# Patient Record
Sex: Male | Born: 1975 | Race: White | Hispanic: No | Marital: Single | Smoking: Current every day smoker
Health system: Southern US, Community
[De-identification: ages and names within clinical notes are randomized; demographics above are authoritative.]

## PROBLEM LIST (undated history)

## (undated) DIAGNOSIS — M62561 Muscle wasting and atrophy, not elsewhere classified, right lower leg: Secondary | ICD-10-CM

## (undated) DIAGNOSIS — R569 Unspecified convulsions: Secondary | ICD-10-CM

## (undated) DIAGNOSIS — S41031A Puncture wound without foreign body of right shoulder, initial encounter: Secondary | ICD-10-CM

## (undated) DIAGNOSIS — I82409 Acute embolism and thrombosis of unspecified deep veins of unspecified lower extremity: Secondary | ICD-10-CM

## (undated) DIAGNOSIS — I714 Abdominal aortic aneurysm, without rupture, unspecified: Secondary | ICD-10-CM

## (undated) DIAGNOSIS — W3400XA Accidental discharge from unspecified firearms or gun, initial encounter: Secondary | ICD-10-CM

## (undated) HISTORY — PX: MANDIBLE SURGERY: SHX707

## (undated) HISTORY — PX: FRACTURE SURGERY: SHX138

## (undated) HISTORY — PX: TONSILLECTOMY: SUR1361

## (undated) HISTORY — DX: Abdominal aortic aneurysm, without rupture, unspecified: I71.40

## (undated) HISTORY — PX: SHOULDER SURGERY: SHX246

---

## 2005-01-05 ENCOUNTER — Emergency Department (HOSPITAL_COMMUNITY): Admission: EM | Admit: 2005-01-05 | Discharge: 2005-01-05 | Payer: Self-pay | Admitting: Emergency Medicine

## 2005-01-16 ENCOUNTER — Emergency Department (HOSPITAL_COMMUNITY): Admission: EM | Admit: 2005-01-16 | Discharge: 2005-01-16 | Payer: Self-pay | Admitting: Emergency Medicine

## 2005-03-10 ENCOUNTER — Encounter: Admission: RE | Admit: 2005-03-10 | Discharge: 2005-03-31 | Payer: Self-pay | Admitting: Podiatry

## 2010-03-15 ENCOUNTER — Emergency Department: Payer: Self-pay | Admitting: Emergency Medicine

## 2010-03-23 ENCOUNTER — Other Ambulatory Visit: Payer: Self-pay

## 2010-03-24 ENCOUNTER — Emergency Department: Payer: Self-pay | Admitting: Unknown Physician Specialty

## 2010-04-09 ENCOUNTER — Emergency Department: Payer: Self-pay | Admitting: Emergency Medicine

## 2010-06-16 ENCOUNTER — Emergency Department (HOSPITAL_COMMUNITY)
Admission: EM | Admit: 2010-06-16 | Discharge: 2010-06-16 | Disposition: A | Discharge status: Home / Self Care | Payer: Medicaid Other | Attending: Emergency Medicine | Admitting: Emergency Medicine

## 2010-06-16 DIAGNOSIS — M25519 Pain in unspecified shoulder: Secondary | ICD-10-CM | POA: Insufficient documentation

## 2010-06-16 DIAGNOSIS — G8929 Other chronic pain: Secondary | ICD-10-CM | POA: Insufficient documentation

## 2010-06-16 DIAGNOSIS — E669 Obesity, unspecified: Secondary | ICD-10-CM | POA: Insufficient documentation

## 2010-06-16 DIAGNOSIS — Z79899 Other long term (current) drug therapy: Secondary | ICD-10-CM | POA: Insufficient documentation

## 2010-06-16 DIAGNOSIS — G40909 Epilepsy, unspecified, not intractable, without status epilepticus: Secondary | ICD-10-CM | POA: Insufficient documentation

## 2012-08-14 ENCOUNTER — Emergency Department: Payer: Self-pay | Admitting: Unknown Physician Specialty

## 2014-12-16 ENCOUNTER — Emergency Department: Payer: Medicaid Other

## 2014-12-16 ENCOUNTER — Emergency Department
Admission: EM | Admit: 2014-12-16 | Discharge: 2014-12-16 | Disposition: A | Discharge status: Home / Self Care | Payer: Medicaid Other | Attending: Emergency Medicine | Admitting: Emergency Medicine

## 2014-12-16 ENCOUNTER — Other Ambulatory Visit: Payer: Self-pay

## 2014-12-16 DIAGNOSIS — R1013 Epigastric pain: Secondary | ICD-10-CM | POA: Insufficient documentation

## 2014-12-16 DIAGNOSIS — Z720 Tobacco use: Secondary | ICD-10-CM | POA: Diagnosis not present

## 2014-12-16 HISTORY — DX: Puncture wound without foreign body of right shoulder, initial encounter: S41.031A

## 2014-12-16 HISTORY — DX: Muscle wasting and atrophy, not elsewhere classified, right lower leg: M62.561

## 2014-12-16 HISTORY — DX: Accidental discharge from unspecified firearms or gun, initial encounter: W34.00XA

## 2014-12-16 LAB — CBC WITH DIFFERENTIAL/PLATELET
BASOS PCT: 1 %
Basophils Absolute: 0.1 10*3/uL (ref 0–0.1)
Eosinophils Absolute: 0.3 10*3/uL (ref 0–0.7)
Eosinophils Relative: 3 %
HEMATOCRIT: 43.7 % (ref 40.0–52.0)
Hemoglobin: 14.5 g/dL (ref 13.0–18.0)
LYMPHS ABS: 2.6 10*3/uL (ref 1.0–3.6)
Lymphocytes Relative: 21 %
MCH: 30.1 pg (ref 26.0–34.0)
MCHC: 33.1 g/dL (ref 32.0–36.0)
MCV: 90.7 fL (ref 80.0–100.0)
MONO ABS: 0.9 10*3/uL (ref 0.2–1.0)
MONOS PCT: 7 %
NEUTROS ABS: 8.5 10*3/uL — AB (ref 1.4–6.5)
Neutrophils Relative %: 68 %
Platelets: 299 10*3/uL (ref 150–440)
RBC: 4.81 MIL/uL (ref 4.40–5.90)
RDW: 13.7 % (ref 11.5–14.5)
WBC: 12.4 10*3/uL — ABNORMAL HIGH (ref 3.8–10.6)

## 2014-12-16 LAB — COMPREHENSIVE METABOLIC PANEL
ALK PHOS: 67 U/L (ref 38–126)
ALT: 11 U/L — ABNORMAL LOW (ref 17–63)
ANION GAP: 8 (ref 5–15)
AST: 16 U/L (ref 15–41)
Albumin: 3.9 g/dL (ref 3.5–5.0)
BILIRUBIN TOTAL: 0.3 mg/dL (ref 0.3–1.2)
BUN: 9 mg/dL (ref 6–20)
CALCIUM: 9 mg/dL (ref 8.9–10.3)
CO2: 28 mmol/L (ref 22–32)
CREATININE: 0.96 mg/dL (ref 0.61–1.24)
Chloride: 103 mmol/L (ref 101–111)
Glucose, Bld: 100 mg/dL — ABNORMAL HIGH (ref 65–99)
Potassium: 3.9 mmol/L (ref 3.5–5.1)
SODIUM: 139 mmol/L (ref 135–145)
TOTAL PROTEIN: 6.7 g/dL (ref 6.5–8.1)

## 2014-12-16 LAB — TROPONIN I: Troponin I: <0.03 ng/mL (ref <0.031)

## 2014-12-16 LAB — LIPASE, BLOOD: LIPASE: 17 U/L — AB (ref 22–51)

## 2014-12-16 MED ORDER — MORPHINE SULFATE (PF) 4 MG/ML IV SOLN
4.0000 mg | Freq: Once | INTRAVENOUS | Status: AC
  Administered 2014-12-16: 4 mg via INTRAVENOUS
  Filled 2014-12-16: qty 1

## 2014-12-16 MED ORDER — IOHEXOL 350 MG/ML SOLN
100.0000 mL | Freq: Once | INTRAVENOUS | Status: AC | PRN
  Administered 2014-12-16: 100 mL via INTRAVENOUS

## 2014-12-16 MED ORDER — ESOMEPRAZOLE MAGNESIUM 40 MG PO CPDR: 40.0000 mg | DELAYED_RELEASE_CAPSULE | Freq: Every day | ORAL | Status: DC

## 2014-12-16 MED ORDER — GI COCKTAIL ~~LOC~~
ORAL | Status: AC
  Filled 2014-12-16: qty 30

## 2014-12-16 MED ORDER — GI COCKTAIL ~~LOC~~
30.0000 mL | Freq: Once | ORAL | Status: AC
Start: 2014-12-16 — End: 2014-12-16
  Administered 2014-12-16: 30 mL via ORAL
  Filled 2014-12-16: qty 30

## 2014-12-16 MED ORDER — IOHEXOL 240 MG/ML SOLN
25.0000 mL | Freq: Once | INTRAMUSCULAR | Status: AC | PRN
  Administered 2014-12-16: 25 mL via ORAL

## 2014-12-16 MED ORDER — GI COCKTAIL ~~LOC~~
30.0000 mL | Freq: Once | ORAL | Status: AC
  Administered 2014-12-16: 30 mL via ORAL

## 2014-12-16 NOTE — Discharge Instructions (Signed)
Abdominal Pain Many things can cause abdominal pain. Usually, abdominal pain is not caused by a disease and will improve without treatment. It can often be observed and treated at home. Your health care provider will do a physical exam and possibly order blood tests and X-rays to help determine the seriousness of your pain. However, in many cases, more time must pass before a clear cause of the pain can be found. Before that point, your health care provider may not know if you need more testing or further treatment. HOME CARE INSTRUCTIONS  Monitor your abdominal pain for any changes. The following actions may help to alleviate any discomfort you are experiencing:  Only take over-the-counter or prescription medicines as directed by your health care provider.  Do not take laxatives unless directed to do so by your health care provider.  Try a clear liquid diet (broth, tea, or water) as directed by your health care provider. Slowly move to a bland diet as tolerated. SEEK MEDICAL CARE IF:  You have unexplained abdominal pain.  You have abdominal pain associated with nausea or diarrhea.  You have pain when you urinate or have a bowel movement.  You experience abdominal pain that wakes you in the night.  You have abdominal pain that is worsened or improved by eating food.  You have abdominal pain that is worsened with eating fatty foods.  You have a fever. SEEK IMMEDIATE MEDICAL CARE IF:   Your pain does not go away within 2 hours.  You keep throwing up (vomiting).  Your pain is felt only in portions of the abdomen, such as the right side or the left lower portion of the abdomen.  You pass bloody or black tarry stools. MAKE SURE YOU:  Understand these instructions.   Will watch your condition.   Will get help right away if you are not doing well or get worse.  Document Released: 01/21/2005 Document Revised: 04/18/2013 Document Reviewed: 12/21/2012 [facility] Patient Information  2015 [place], [place]. This information is not intended to replace advice given to you by your health care provider. Make sure you discuss any questions you have with your health care provider.   Please take the nexium as directed. Follow up with your primary care doctor. Return if worse at all.
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
## 2014-12-16 NOTE — ED Notes (Signed)
According to EMS, pt states he has epigastric pain that radiates into his LEFT AND RIGHT CHEST AND TO ARMS BILATERALLY. EMS STATES PAIN WAS ABLE TO BE REPRODUCED WITH ANY MOVEMENTS.  EMS Administerd x2 Nitro tablets and  Aspirin.

## 2015-10-04 ENCOUNTER — Emergency Department
Admission: EM | Admit: 2015-10-04 | Discharge: 2015-10-04 | Disposition: A | Discharge status: Home / Self Care | Payer: Medicaid Other | Attending: Emergency Medicine | Admitting: Emergency Medicine

## 2015-10-04 ENCOUNTER — Encounter: Payer: Self-pay | Admitting: Emergency Medicine

## 2015-10-04 ENCOUNTER — Emergency Department: Payer: Medicaid Other

## 2015-10-04 DIAGNOSIS — G40909 Epilepsy, unspecified, not intractable, without status epilepticus: Secondary | ICD-10-CM | POA: Insufficient documentation

## 2015-10-04 DIAGNOSIS — T782XXA Anaphylactic shock, unspecified, initial encounter: Secondary | ICD-10-CM | POA: Diagnosis not present

## 2015-10-04 DIAGNOSIS — Z79899 Other long term (current) drug therapy: Secondary | ICD-10-CM | POA: Insufficient documentation

## 2015-10-04 DIAGNOSIS — F17200 Nicotine dependence, unspecified, uncomplicated: Secondary | ICD-10-CM | POA: Diagnosis not present

## 2015-10-04 DIAGNOSIS — R42 Dizziness and giddiness: Secondary | ICD-10-CM | POA: Diagnosis present

## 2015-10-04 DIAGNOSIS — R569 Unspecified convulsions: Secondary | ICD-10-CM

## 2015-10-04 HISTORY — DX: Unspecified convulsions: R56.9

## 2015-10-04 LAB — COMPREHENSIVE METABOLIC PANEL
ALK PHOS: 52 U/L (ref 38–126)
ALT: 12 U/L — AB (ref 17–63)
ANION GAP: 14 (ref 5–15)
AST: 23 U/L (ref 15–41)
Albumin: 3.9 g/dL (ref 3.5–5.0)
BUN: 12 mg/dL (ref 6–20)
CALCIUM: 8.3 mg/dL — AB (ref 8.9–10.3)
CHLORIDE: 109 mmol/L (ref 101–111)
CO2: 18 mmol/L — ABNORMAL LOW (ref 22–32)
CREATININE: 1.35 mg/dL — AB (ref 0.61–1.24)
Glucose, Bld: 142 mg/dL — ABNORMAL HIGH (ref 65–99)
Potassium: 2.8 mmol/L — CL (ref 3.5–5.1)
Sodium: 141 mmol/L (ref 135–145)
TOTAL PROTEIN: 6.4 g/dL — AB (ref 6.5–8.1)
Total Bilirubin: 0.8 mg/dL (ref 0.3–1.2)

## 2015-10-04 LAB — CBC WITH DIFFERENTIAL/PLATELET
BASOS ABS: 0 10*3/uL (ref 0–0.1)
EOS ABS: 0.2 10*3/uL (ref 0–0.7)
HCT: 48.9 % (ref 40.0–52.0)
Hemoglobin: 16.3 g/dL (ref 13.0–18.0)
Lymphocytes Relative: 15 %
Lymphs Abs: 2.6 10*3/uL (ref 1.0–3.6)
MCH: 30.6 pg (ref 26.0–34.0)
MCHC: 33.3 g/dL (ref 32.0–36.0)
MCV: 92.1 fL (ref 80.0–100.0)
MONO ABS: 0.6 10*3/uL (ref 0.2–1.0)
Monocytes Relative: 3 %
Neutro Abs: 13.5 10*3/uL — ABNORMAL HIGH (ref 1.4–6.5)
Neutrophils Relative %: 81 %
PLATELETS: 304 10*3/uL (ref 150–440)
RBC: 5.31 MIL/uL (ref 4.40–5.90)
RDW: 14.5 % (ref 11.5–14.5)
WBC: 16.9 10*3/uL — ABNORMAL HIGH (ref 3.8–10.6)

## 2015-10-04 LAB — MAGNESIUM: Magnesium: 1.5 mg/dL — ABNORMAL LOW (ref 1.7–2.4)

## 2015-10-04 MED ORDER — MAGNESIUM SULFATE 2 GM/50ML IV SOLN
2.0000 g | Freq: Once | INTRAVENOUS | Status: AC
  Administered 2015-10-04: 2 g via INTRAVENOUS
  Filled 2015-10-04: qty 50

## 2015-10-04 MED ORDER — ONDANSETRON HCL 4 MG/2ML IJ SOLN
4.0000 mg | Freq: Once | INTRAMUSCULAR | Status: AC
  Administered 2015-10-04: 4 mg via INTRAVENOUS
  Filled 2015-10-04: qty 2

## 2015-10-04 MED ORDER — MORPHINE SULFATE (PF) 4 MG/ML IV SOLN
4.0000 mg | Freq: Once | INTRAVENOUS | Status: AC
  Administered 2015-10-04: 4 mg via INTRAVENOUS
  Filled 2015-10-04: qty 1

## 2015-10-04 MED ORDER — ACETAMINOPHEN 500 MG PO TABS
1000.0000 mg | ORAL_TABLET | Freq: Once | ORAL | Status: AC
  Administered 2015-10-04: 1000 mg via ORAL
  Filled 2015-10-04: qty 2

## 2015-10-04 MED ORDER — POTASSIUM CHLORIDE 20 MEQ/15ML (10%) PO SOLN
40.0000 meq | Freq: Once | ORAL | Status: AC
  Administered 2015-10-04: 40 meq via ORAL
  Filled 2015-10-04: qty 30

## 2015-10-04 MED ORDER — METHYLPREDNISOLONE SODIUM SUCC 125 MG IJ SOLR
125.0000 mg | Freq: Once | INTRAMUSCULAR | Status: AC
  Administered 2015-10-04: 125 mg via INTRAVENOUS
  Filled 2015-10-04: qty 2
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
## 2015-10-04 NOTE — ED Notes (Signed)
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  Pt. States he was stung once behind head.  Pt. States soon after he felt dizzy and thinks he may have had seizure. Pt. Denies being on seizure medication.  Pt. States he has been feeling weak for the past couple days.

## 2015-10-04 NOTE — ED Notes (Signed)
Pt. States he was stung by a [place] hornet to the back of head.  Pt. States he had seizure like activity soon after getting stung.

## 2015-10-04 NOTE — ED Provider Notes (Signed)
Patient is awake alert states no issues. Was being observed, and the patient noted that he is ready to go. He is hemodynamically stable, awake alert and has no complaints or concerns at this time. Patient being discharged home with prescriptions as provided by Dr. Darnelle CatalanMalinda.  Return precautions and treatment recommendations and follow-up discussed with the patient who is agreeable with the plan.   Sharyn CreamerMark Chatara Lucente, MD 10/04/15 [phone]

## 2015-10-04 NOTE — Discharge Instructions (Signed)
Anaphylactic Reaction °An anaphylactic reaction is a sudden, severe allergic reaction that involves the whole body. It can be life threatening. A hospital stay is often required. People with asthma, eczema, or hay fever are slightly more likely to have an anaphylactic reaction. °CAUSES  °An anaphylactic reaction may be caused by anything to which you are allergic. After being exposed to the allergic substance, your immune system becomes sensitized to it. When you are exposed to that allergic substance again, an allergic reaction can occur. Common causes of an anaphylactic reaction include: °· Medicines. °· Foods, especially peanuts, wheat, shellfish, milk, and eggs. °· Insect bites or stings. °· Blood products. °· Chemicals, such as dyes, latex, and contrast material used for imaging tests. °SYMPTOMS  °When an allergic reaction occurs, the body releases histamine and other substances. These substances cause symptoms such as tightening of the airway. Symptoms often develop within seconds or minutes of exposure. Symptoms may include: °· Skin rash or hives. °· Itching. °· Chest tightness. °· Swelling of the eyes, tongue, or lips. °· Trouble breathing or swallowing. °· Lightheadedness or fainting. °· Anxiety or confusion. °· Stomach pains, vomiting, or diarrhea. °· Nasal congestion. °· A fast or irregular heartbeat (palpitations). °DIAGNOSIS  °Diagnosis is based on your history of recent exposure to allergic substances, your symptoms, and a physical exam. Your caregiver may also perform blood or urine tests to confirm the diagnosis. °TREATMENT  °Epinephrine medicine is the main treatment for an anaphylactic reaction. Other medicines that may be used for treatment include antihistamines, steroids, and albuterol. In severe cases, fluids and medicine to support blood pressure may be given through an intravenous line (IV). Even if you improve after treatment, you need to be observed to make sure your condition does not get  worse. This may require a stay in the hospital. °HOME CARE INSTRUCTIONS  °· Wear a medical alert bracelet or necklace stating your allergy. °· You and your family must learn how to use an anaphylaxis kit or give an epinephrine injection to temporarily treat an emergency allergic reaction. Always carry your epinephrine injection or anaphylaxis kit with you. This can be lifesaving if you have a severe reaction. °· Do not drive or perform tasks after treatment until the medicines used to treat your reaction have worn off, or until your caregiver says it is okay. °· If you have hives or a rash: °¨ Take medicines as directed by your caregiver. °¨ You may use an over-the-counter antihistamine (diphenhydramine) as needed. °¨ Apply cold compresses to the skin or take baths in cool water. Avoid hot baths or showers. °SEEK MEDICAL CARE IF:  °· You develop symptoms of an allergic reaction to a new substance. Symptoms may start right away or minutes later. °· You develop a rash, hives, or itching. °· You develop new symptoms. °SEEK IMMEDIATE MEDICAL CARE IF:  °· You have swelling of the mouth, difficulty breathing, or wheezing. °· You have a tight feeling in your chest or throat. °· You develop hives, swelling, or itching all over your body. °· You develop severe vomiting or diarrhea. °· You feel faint or pass out. °This is an emergency. Use your epinephrine injection or anaphylaxis kit as you have been instructed. Call your local emergency services (911 in [place] Even if you improve after the injection, you need to be examined at a hospital emergency department. °MAKE SURE YOU:  °· Understand these instructions. °· Will watch your condition. °· Will get help right away if you are not   doing well or get worse.   This information is not intended to replace advice given to you by your health care provider. Make sure you discuss any questions you have with your health care provider.   Document Released: 04/13/2005 Document  Revised: 04/18/2013 Document Reviewed: 10/24/2014 Elsevier Interactive Patient Education 2016 [place].   Please call Dr. Manuella Ghazi the neurologist on Monday to arrange follow-up. Please return here for any further problems. Please keep the EpiPen with you just in case you get stung by another one of these insects. If you start getting lightheaded or throat starts to close can give yourself the shot. After that immediately call 911. Please take the prednisone tomorrow and Benadryl to the over-the-counter pills 4 times a day tomorrow. Please take the Zantac tomorrow as well.

## 2016-03-25 ENCOUNTER — Emergency Department
Admission: EM | Admit: 2016-03-25 | Discharge: 2016-03-25 | Disposition: A | Discharge status: Home / Self Care | Payer: Medicaid Other | Attending: Emergency Medicine | Admitting: Emergency Medicine

## 2016-03-25 ENCOUNTER — Encounter: Payer: Self-pay | Admitting: *Deleted

## 2016-03-25 DIAGNOSIS — R569 Unspecified convulsions: Secondary | ICD-10-CM | POA: Diagnosis present

## 2016-03-25 DIAGNOSIS — G8929 Other chronic pain: Secondary | ICD-10-CM | POA: Diagnosis not present

## 2016-03-25 DIAGNOSIS — F17200 Nicotine dependence, unspecified, uncomplicated: Secondary | ICD-10-CM | POA: Insufficient documentation

## 2016-03-25 DIAGNOSIS — G40909 Epilepsy, unspecified, not intractable, without status epilepticus: Secondary | ICD-10-CM | POA: Diagnosis not present

## 2016-03-25 DIAGNOSIS — M25511 Pain in right shoulder: Secondary | ICD-10-CM | POA: Diagnosis not present

## 2016-03-25 LAB — CBC WITH DIFFERENTIAL/PLATELET
BASOS ABS: 0.1 10*3/uL (ref 0–0.1)
BASOS PCT: 1 %
EOS PCT: 2 %
Eosinophils Absolute: 0.3 10*3/uL (ref 0–0.7)
HCT: 44.1 % (ref 40.0–52.0)
Hemoglobin: 14.8 g/dL (ref 13.0–18.0)
Lymphocytes Relative: 18 %
Lymphs Abs: 2.5 10*3/uL (ref 1.0–3.6)
MCH: 30.5 pg (ref 26.0–34.0)
MCHC: 33.5 g/dL (ref 32.0–36.0)
MCV: 91.1 fL (ref 80.0–100.0)
MONO ABS: 0.8 10*3/uL (ref 0.2–1.0)
Monocytes Relative: 6 %
Neutro Abs: 10.2 10*3/uL — ABNORMAL HIGH (ref 1.4–6.5)
Neutrophils Relative %: 73 %
PLATELETS: 311 10*3/uL (ref 150–440)
RBC: 4.84 MIL/uL (ref 4.40–5.90)
RDW: 16 % — AB (ref 11.5–14.5)
WBC: 13.9 10*3/uL — ABNORMAL HIGH (ref 3.8–10.6)

## 2016-03-25 LAB — COMPREHENSIVE METABOLIC PANEL
ALK PHOS: 58 U/L (ref 38–126)
ALT: 24 U/L (ref 17–63)
ANION GAP: 12 (ref 5–15)
AST: 35 U/L (ref 15–41)
Albumin: 4.2 g/dL (ref 3.5–5.0)
BILIRUBIN TOTAL: 0.6 mg/dL (ref 0.3–1.2)
BUN: 13 mg/dL (ref 6–20)
CALCIUM: 9.2 mg/dL (ref 8.9–10.3)
CO2: 19 mmol/L — ABNORMAL LOW (ref 22–32)
Chloride: 107 mmol/L (ref 101–111)
Creatinine, Ser: 1.06 mg/dL (ref 0.61–1.24)
GFR calc non Af Amer: >60 mL/min (ref >60)
Glucose, Bld: 115 mg/dL — ABNORMAL HIGH (ref 65–99)
Potassium: 4.3 mmol/L (ref 3.5–5.1)
Sodium: 138 mmol/L (ref 135–145)
TOTAL PROTEIN: 7.4 g/dL (ref 6.5–8.1)

## 2016-03-25 LAB — ETHANOL

## 2016-03-25 LAB — ACETAMINOPHEN LEVEL: Acetaminophen (Tylenol), Serum: <10 ug/mL — ABNORMAL LOW (ref 10–30)

## 2016-03-25 LAB — SALICYLATE LEVEL

## 2016-03-25 LAB — LIPASE, BLOOD: LIPASE: 20 U/L (ref 11–51)

## 2016-03-25 NOTE — ED Triage Notes (Signed)
Per EMS report, patient's girlfriend reported a seizure of unknown duration. Upon EMS arrival, patient couldn't remember the year or that Thanksgiving was recent, but improved during the transport here. Patient has a history of seizure and said that he was taken off Gabapentin 5months ago which he was taking for seizures. Patient also states he had a "shaking fit" yesterday.
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
## 2016-03-25 NOTE — ED Notes (Addendum)
Seizure pads are in place. Family at bedside.

## 2016-03-25 NOTE — ED Notes (Signed)
Patient states he is  unable to void at this time. Patient c/o nausea and sharp chest pain that radiates to right shoulder. Patient has had this pain for two years.

## 2016-04-06 ENCOUNTER — Emergency Department
Admission: EM | Admit: 2016-04-06 | Discharge: 2016-04-06 | Disposition: A | Discharge status: Home / Self Care | Payer: Medicaid Other | Attending: Emergency Medicine | Admitting: Emergency Medicine

## 2016-04-06 ENCOUNTER — Encounter: Payer: Self-pay | Admitting: Emergency Medicine

## 2016-04-06 DIAGNOSIS — F17200 Nicotine dependence, unspecified, uncomplicated: Secondary | ICD-10-CM | POA: Diagnosis not present

## 2016-04-06 DIAGNOSIS — Y929 Unspecified place or not applicable: Secondary | ICD-10-CM | POA: Insufficient documentation

## 2016-04-06 DIAGNOSIS — S0993XA Unspecified injury of face, initial encounter: Secondary | ICD-10-CM | POA: Diagnosis present

## 2016-04-06 DIAGNOSIS — W19XXXA Unspecified fall, initial encounter: Secondary | ICD-10-CM | POA: Insufficient documentation

## 2016-04-06 DIAGNOSIS — Y999 Unspecified external cause status: Secondary | ICD-10-CM | POA: Diagnosis not present

## 2016-04-06 DIAGNOSIS — K047 Periapical abscess without sinus: Secondary | ICD-10-CM

## 2016-04-06 DIAGNOSIS — Y939 Activity, unspecified: Secondary | ICD-10-CM | POA: Insufficient documentation

## 2016-04-06 DIAGNOSIS — K029 Dental caries, unspecified: Secondary | ICD-10-CM | POA: Insufficient documentation

## 2016-04-06 DIAGNOSIS — S025XXA Fracture of tooth (traumatic), initial encounter for closed fracture: Secondary | ICD-10-CM | POA: Insufficient documentation

## 2016-04-06 MED ORDER — AMOXICILLIN 875 MG PO TABS: 875.0000 mg | ORAL_TABLET | Freq: Two times a day (BID) | ORAL | 0 refills | Status: DC

## 2016-04-06 MED ORDER — LIDOCAINE-EPINEPHRINE 2 %-1:100000 IJ SOLN
1.7000 mL | Freq: Once | INTRAMUSCULAR | Status: DC
  Filled 2016-04-06: qty 1.7

## 2016-04-06 MED ORDER — MAGIC MOUTHWASH W/LIDOCAINE: 5.0000 mL | Freq: Four times a day (QID) | ORAL | 0 refills | Status: DC
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
## 2016-04-06 NOTE — ED Triage Notes (Signed)
States he had a filling fall out   Developed increased pain and swelling to right side of face

## 2016-04-06 NOTE — Discharge Instructions (Signed)
OPTIONS FOR DENTAL FOLLOW UP CARE ° °[place] Department of Health and Human Services - Local Safety Net Dental Clinics °http://www.ncdhhs.gov/dph/oralhealth/services/safetynetclinics.htm °  °[facility] (336-562-3123) ° °[facility] [place] ([phone]) ° °[facility] ([phone] ext [phone]) ° °[facility] ([phone]) ° °[facility] ([phone]) °This clinic caters to the indigent population and is on a lottery system. °Location: °[facility], [address], [place] °Clinic Hours: °Wednesdays from 6pm - 9pm, patients seen by a lottery system. °For dates, call or go to [email or web address]/[email or web address] °Services: °Cleanings, fillings and simple extractions. °Payment Options: °DENTAL WORK IS FREE OF CHARGE. Bring proof of income or support. °Best way to get seen: °Arrive at 5:15 pm - this is a lottery, NOT first come/first serve, so arriving earlier will not increase your chances of being seen. °  °  °[facility] °[phone] °Select option 1 for emergencies °  °Location: °[facility], [address], [place] °Clinic Hours: °No walk-ins accepted - call the day before to schedule an appointment. °Check in times are 9:30 am and 1:30 pm. °Services: °Simple extractions, temporary fillings, pulpectomy/pulp debridement, uncomplicated abscess drainage. °Payment Options: °PAYMENT IS DUE AT THE TIME OF SERVICE.  Fee is usually $100-200, additional surgical procedures (e.g. abscess drainage) may be extra. °Cash, checks, Visa/MasterCard accepted.  Can file Medicaid if patient is covered for dental - patient should call case worker to check. °No discount for [facility] patients. °Best way to get seen: °MUST call the day before and get onto the schedule. Can usually be seen the next 1-2 days. No walk-ins accepted. °  °  °[facility] °[phone] °   °Location: °[facility], [address], [place] °Clinic Hours: °M, W, Th, F 8am or 1:30pm, Tues 9a or 1:30 - first come/first served. °Services: °Simple extractions, temporary fillings, uncomplicated abscess drainage.  You do not need to be an [place] resident. °Payment Options: °PAYMENT IS DUE AT THE TIME OF SERVICE. °Dental insurance, otherwise sliding scale - bring proof of income or support. °Depending on income and treatment needed, cost is usually $50-200. °Best way to get seen: °Arrive early as it is first come/first served. °  °  °[facility] °[phone] °  °Location: °[address] °Clinic Hours: °Mon-Thu 8a-5p °Services: °Most basic dental services including extractions and fillings. °Payment Options: °PAYMENT IS DUE AT THE TIME OF SERVICE. °Sliding scale, up to 50% off - bring proof if income or support. °Medicaid with dental option accepted. °Best way to get seen: °Call to schedule an appointment, can usually be seen within 2 weeks OR they will try to see walk-ins - show up at 8a or 2p (you may have to wait). °  °  °[facility] °[phone] °[place] RESIDENTS ONLY °  °Location: °[facility], [address], [place], [place] [address] °Clinic Hours: By appointment only. °Monday - Thursday 8am-5pm, Friday 8am-12pm °Services: Cleanings, fillings, extractions. °Payment Options: °PAYMENT IS DUE AT THE TIME OF SERVICE. °Cash, Visa or MasterCard. Sliding scale - $30 minimum per service. °Best way to get seen: °Come in to office, complete packet and make an appointment - need proof of income °or support monies for each household member and proof of [place] residence. °Usually takes about a month to get in. °  °  °[facility] °[phone] °  °Location: °[address]   [place] °Clinic Hours: Walk-in Urgent Care Dental Services are offered Monday-Friday  mornings only. °The numbers of emergencies accepted daily is limited to the number of °providers available. °Maximum 15 - Mondays, Wednesdays & Thursdays °Maximum 10 - Tuesdays & Fridays °Services: °You do not need to be a [place] resident to be seen for a dental emergency. °Emergencies are defined as pain, swelling, abnormal bleeding, or dental trauma. Walkins will receive x-rays if needed. °NOTE: Dental cleaning is not an emergency. °Payment Options: °PAYMENT IS DUE AT THE TIME OF SERVICE. °Minimum co-pay is $40.00 for uninsured patients. °Minimum co-pay is $3.00 for Medicaid with dental coverage. °Dental Insurance is accepted and must be presented at time of visit. °Medicare does not cover dental. °Forms of payment: Cash, credit card, checks. °Best way to get seen: °If not previously registered with the clinic, walk-in dental registration begins at 7:15 am and is on a first come/first serve basis. °If previously registered with the clinic, call to make an appointment. °  °  °[facility] °[phone] °[place] RESIDENTS ONLY °  °Location: °[address], [place], [place] °Clinic Hours: °Mon-Thu 10a-2p °Services: Extractions only! °Payment Options: °FREE (donations accepted) - bring proof of income or support °Best way to get seen: °Call and schedule an appointment OR come at 8am on the 1st Monday of every month (except for holidays) when it is first come/first served. °  °  °[facility] °[phone] °  °Location: °[address], [place] °Clinic Hours: °Friday mornings °Services, Payment Options, Best way to get seen: °Call for info °

## 2017-08-02 ENCOUNTER — Emergency Department (HOSPITAL_COMMUNITY): Payer: Medicaid Other

## 2017-08-02 ENCOUNTER — Encounter (HOSPITAL_COMMUNITY): Payer: Self-pay | Admitting: Emergency Medicine

## 2017-08-02 ENCOUNTER — Emergency Department (HOSPITAL_COMMUNITY)
Admission: EM | Admit: 2017-08-02 | Discharge: 2017-08-03 | Disposition: A | Discharge status: Home / Self Care | Payer: Medicaid Other | Attending: Emergency Medicine | Admitting: Emergency Medicine

## 2017-08-02 ENCOUNTER — Other Ambulatory Visit: Payer: Self-pay

## 2017-08-02 DIAGNOSIS — Y9389 Activity, other specified: Secondary | ICD-10-CM | POA: Diagnosis not present

## 2017-08-02 DIAGNOSIS — Z79899 Other long term (current) drug therapy: Secondary | ICD-10-CM | POA: Insufficient documentation

## 2017-08-02 DIAGNOSIS — S299XXA Unspecified injury of thorax, initial encounter: Secondary | ICD-10-CM | POA: Insufficient documentation

## 2017-08-02 DIAGNOSIS — S01411A Laceration without foreign body of right cheek and temporomandibular area, initial encounter: Secondary | ICD-10-CM | POA: Diagnosis not present

## 2017-08-02 DIAGNOSIS — S0181XA Laceration without foreign body of other part of head, initial encounter: Secondary | ICD-10-CM

## 2017-08-02 DIAGNOSIS — Y999 Unspecified external cause status: Secondary | ICD-10-CM | POA: Diagnosis not present

## 2017-08-02 DIAGNOSIS — S3991XA Unspecified injury of abdomen, initial encounter: Secondary | ICD-10-CM | POA: Diagnosis not present

## 2017-08-02 DIAGNOSIS — S0990XA Unspecified injury of head, initial encounter: Secondary | ICD-10-CM | POA: Diagnosis present

## 2017-08-02 DIAGNOSIS — Z23 Encounter for immunization: Secondary | ICD-10-CM | POA: Diagnosis not present

## 2017-08-02 DIAGNOSIS — S51811A Laceration without foreign body of right forearm, initial encounter: Secondary | ICD-10-CM | POA: Diagnosis not present

## 2017-08-02 DIAGNOSIS — F17210 Nicotine dependence, cigarettes, uncomplicated: Secondary | ICD-10-CM | POA: Insufficient documentation

## 2017-08-02 DIAGNOSIS — Y92410 Unspecified street and highway as the place of occurrence of the external cause: Secondary | ICD-10-CM | POA: Diagnosis not present

## 2017-08-02 LAB — COMPREHENSIVE METABOLIC PANEL
ALBUMIN: 3.8 g/dL (ref 3.5–5.0)
ALT: 19 U/L (ref 17–63)
AST: 36 U/L (ref 15–41)
Alkaline Phosphatase: 68 U/L (ref 38–126)
Anion gap: 13 (ref 5–15)
BUN: 13 mg/dL (ref 6–20)
CO2: 25 mmol/L (ref 22–32)
Calcium: 9.1 mg/dL (ref 8.9–10.3)
Chloride: 101 mmol/L (ref 101–111)
Creatinine, Ser: 1.03 mg/dL (ref 0.61–1.24)
GFR calc Af Amer: >60 mL/min (ref >60)
GLUCOSE: 84 mg/dL (ref 65–99)
POTASSIUM: 4.4 mmol/L (ref 3.5–5.1)
Sodium: 139 mmol/L (ref 135–145)
Total Bilirubin: 1.2 mg/dL (ref 0.3–1.2)
Total Protein: 6.1 g/dL — ABNORMAL LOW (ref 6.5–8.1)

## 2017-08-02 LAB — CBC
HEMATOCRIT: 44.3 % (ref 39.0–52.0)
Hemoglobin: 14.7 g/dL (ref 13.0–17.0)
MCH: 30.8 pg (ref 26.0–34.0)
MCHC: 33.2 g/dL (ref 30.0–36.0)
MCV: 92.7 fL (ref 78.0–100.0)
Platelets: 324 10*3/uL (ref 150–400)
RBC: 4.78 MIL/uL (ref 4.22–5.81)
RDW: 14.5 % (ref 11.5–15.5)
WBC: 18 10*3/uL — AB (ref 4.0–10.5)

## 2017-08-02 LAB — RAPID URINE DRUG SCREEN, HOSP PERFORMED
Amphetamines: NOT DETECTED
BENZODIAZEPINES: POSITIVE — AB
Barbiturates: NOT DETECTED
Cocaine: NOT DETECTED
Opiates: NOT DETECTED
Tetrahydrocannabinol: POSITIVE — AB

## 2017-08-02 LAB — I-STAT CHEM 8, ED
BUN: 14 mg/dL (ref 6–20)
Calcium, Ion: 1.13 mmol/L — ABNORMAL LOW (ref 1.15–1.40)
Chloride: 103 mmol/L (ref 101–111)
Creatinine, Ser: 1 mg/dL (ref 0.61–1.24)
Glucose, Bld: 90 mg/dL (ref 65–99)
HEMATOCRIT: 44 % (ref 39.0–52.0)
Hemoglobin: 15 g/dL (ref 13.0–17.0)
Potassium: 4.2 mmol/L (ref 3.5–5.1)
SODIUM: 139 mmol/L (ref 135–145)
TCO2: 29 mmol/L (ref 22–32)

## 2017-08-02 LAB — URINALYSIS, ROUTINE W REFLEX MICROSCOPIC
BACTERIA UA: NONE SEEN
Bilirubin Urine: NEGATIVE
Glucose, UA: NEGATIVE mg/dL
KETONES UR: NEGATIVE mg/dL
LEUKOCYTES UA: NEGATIVE
Nitrite: NEGATIVE
PH: 6 (ref 5.0–8.0)
PROTEIN: NEGATIVE mg/dL
SQUAMOUS EPITHELIAL / LPF: NONE SEEN
Specific Gravity, Urine: 1.015 (ref 1.005–1.030)

## 2017-08-02 LAB — CDS SEROLOGY

## 2017-08-02 LAB — I-STAT CG4 LACTIC ACID, ED: LACTIC ACID, VENOUS: 1.2 mmol/L (ref 0.5–1.9)

## 2017-08-02 LAB — PROTIME-INR
INR: 0.99
PROTHROMBIN TIME: 13 s (ref 11.4–15.2)

## 2017-08-02 MED ORDER — SODIUM CHLORIDE 0.9 % IV BOLUS
1000.0000 mL | Freq: Once | INTRAVENOUS | Status: AC
  Administered 2017-08-02: 1000 mL via INTRAVENOUS

## 2017-08-02 MED ORDER — TETANUS-DIPHTH-ACELL PERTUSSIS 5-2.5-18.5 LF-MCG/0.5 IM SUSP
0.5000 mL | Freq: Once | INTRAMUSCULAR | Status: AC
  Administered 2017-08-02: 0.5 mL via INTRAMUSCULAR
  Filled 2017-08-02: qty 0.5

## 2017-08-02 MED ORDER — FENTANYL CITRATE (PF) 100 MCG/2ML IJ SOLN: 50.0000 ug | Freq: Once | INTRAMUSCULAR | Status: DC

## 2017-08-02 MED ORDER — FENTANYL CITRATE (PF) 100 MCG/2ML IJ SOLN
50.0000 ug | Freq: Once | INTRAMUSCULAR | Status: AC
  Administered 2017-08-02: 50 ug via INTRAVENOUS
  Filled 2017-08-02: qty 2

## 2017-08-02 MED ORDER — OXYCODONE HCL 5 MG PO TABS
5.0000 mg | ORAL_TABLET | Freq: Once | ORAL | Status: AC
  Administered 2017-08-02: 5 mg via ORAL
  Filled 2017-08-02: qty 1

## 2017-08-02 MED ORDER — IOPAMIDOL (ISOVUE-300) INJECTION 61%
INTRAVENOUS | Status: AC
  Filled 2017-08-02: qty 100

## 2017-08-02 MED ORDER — SODIUM CHLORIDE 0.9 % IV SOLN
INTRAVENOUS | Status: DC
  Administered 2017-08-02: 20:00:00 via INTRAVENOUS

## 2017-08-02 MED ORDER — LIDOCAINE HCL (PF) 1 % IJ SOLN
5.0000 mL | Freq: Once | INTRAMUSCULAR | Status: AC
  Administered 2017-08-02: 5 mL
  Filled 2017-08-02: qty 5

## 2017-08-02 MED ORDER — IOPAMIDOL (ISOVUE-300) INJECTION 61%
100.0000 mL | Freq: Once | INTRAVENOUS | Status: AC | PRN
  Administered 2017-08-02: 100 mL via INTRAVENOUS
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
## 2017-08-02 NOTE — ED Notes (Signed)
Family at bedside, ALL [facility]
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
## 2017-08-03 NOTE — ED Notes (Signed)
Patient Alert and oriented to baseline. Stable and ambulatory to baseline. Patient verbalized understanding of the discharge instructions.  Patient belongings were taken by the patient.   

## 2017-08-12 NOTE — ED Notes (Signed)
08/12/2017, Pt. Called and requested records of this ed visit.  This RN gave him information to Medical Records and this is the department that he can get copies of his visit.

## 2017-08-14 ENCOUNTER — Emergency Department: Payer: Medicaid Other

## 2017-08-14 ENCOUNTER — Other Ambulatory Visit: Payer: Self-pay

## 2017-08-14 ENCOUNTER — Emergency Department
Admission: EM | Admit: 2017-08-14 | Discharge: 2017-08-14 | Disposition: A | Discharge status: Home / Self Care | Payer: Medicaid Other | Attending: Emergency Medicine | Admitting: Emergency Medicine

## 2017-08-14 DIAGNOSIS — R0789 Other chest pain: Secondary | ICD-10-CM | POA: Diagnosis present

## 2017-08-14 DIAGNOSIS — Z79899 Other long term (current) drug therapy: Secondary | ICD-10-CM | POA: Diagnosis not present

## 2017-08-14 DIAGNOSIS — F17200 Nicotine dependence, unspecified, uncomplicated: Secondary | ICD-10-CM | POA: Insufficient documentation

## 2017-08-14 MED ORDER — OXYCODONE HCL 5 MG PO TABS
5.0000 mg | ORAL_TABLET | Freq: Once | ORAL | Status: AC
Start: 2017-08-14 — End: 2017-08-14
  Administered 2017-08-14: 5 mg via ORAL
  Filled 2017-08-14: qty 1

## 2017-08-14 MED ORDER — CYCLOBENZAPRINE HCL 10 MG PO TABS: 10.0000 mg | ORAL_TABLET | Freq: Three times a day (TID) | ORAL | 0 refills | Status: DC | PRN
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
## 2017-09-16 ENCOUNTER — Other Ambulatory Visit: Payer: Self-pay | Admitting: Pain Medicine

## 2017-09-16 DIAGNOSIS — G40309 Generalized idiopathic epilepsy and epileptic syndromes, not intractable, without status epilepticus: Secondary | ICD-10-CM

## 2017-09-22 ENCOUNTER — Other Ambulatory Visit: Payer: Medicaid Other

## 2017-09-22 ENCOUNTER — Ambulatory Visit: Payer: Medicaid Other | Attending: Pain Medicine

## 2017-09-22 DIAGNOSIS — G40309 Generalized idiopathic epilepsy and epileptic syndromes, not intractable, without status epilepticus: Secondary | ICD-10-CM | POA: Diagnosis not present

## 2017-09-22 NOTE — Progress Notes (Unsigned)
eeg completed ° °
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
## 2017-09-28 ENCOUNTER — Other Ambulatory Visit: Payer: Self-pay | Admitting: Pain Medicine

## 2017-09-28 DIAGNOSIS — G40309 Generalized idiopathic epilepsy and epileptic syndromes, not intractable, without status epilepticus: Secondary | ICD-10-CM

## 2017-10-07 ENCOUNTER — Ambulatory Visit
Admission: RE | Admit: 2017-10-07 | Discharge: 2017-10-07 | Disposition: A | Discharge status: Home / Self Care | Payer: Medicaid Other | Source: Ambulatory Visit | Attending: Pain Medicine | Admitting: Pain Medicine

## 2017-10-07 DIAGNOSIS — G40309 Generalized idiopathic epilepsy and epileptic syndromes, not intractable, without status epilepticus: Secondary | ICD-10-CM

## 2017-10-19 ENCOUNTER — Other Ambulatory Visit: Payer: Self-pay | Admitting: Neurology

## 2017-10-20 NOTE — Procedures (Signed)
ELECTROENCEPHALOGRAM REPORT   Patient: Tyler Bowman       Room #: OP  Age: 42 y.o.        Sex: male Referring Physician: OP Report Date:  10/20/2017        Interpreting Physician: Thana FarrEYNOLDS, Keshauna Degraffenreid  History: Tyler KoMichael E Nurse is an 42 y.o. male with a history of seizure  Medications:  None listed  Conditions of Recording:  This is a 16 channel EEG carried out with the patient in the awake and drowsy states.  Description:  The waking background activity consists of a low voltage, symmetrical, fairly well organized, 9 Hz alpha activity, seen from the parieto-occipital and posterior temporal regions.  Low voltage fast activity, poorly organized, is seen anteriorly and is at times superimposed on more posterior regions.  A mixture of theta and alpha rhythms are seen from the central and temporal regions. The patient drowses with slowing to irregular, low voltage theta and beta activity.   Stage II sleep is not obtained. No epileptiform activity is noted.   Hyperventilation was not performed.  Intermittent photic stimulation was performed but failed to illicit any change in the tracing.    IMPRESSION: Normal electroencephalogram, awake, drowsy and with activation procedures. There are no focal lateralizing or epileptiform features.   Thana FarrLeslie Miran Kautzman, MD Neurology [phone] 10/20/2017, 12:03 PM

## 2020-01-23 IMAGING — CR DG CHEST 2V
1 series · 2 of 2 positions shown · non-contrast
Comparison: CT 08/02/2017, radiograph 08/02/2017

CLINICAL DATA: Rib pain

EXAM:
CHEST - 2 VIEW

[Series 1: dg chest 2 view · 0.14mm/px · 2 of 2 slices shown]
[im 1/2]
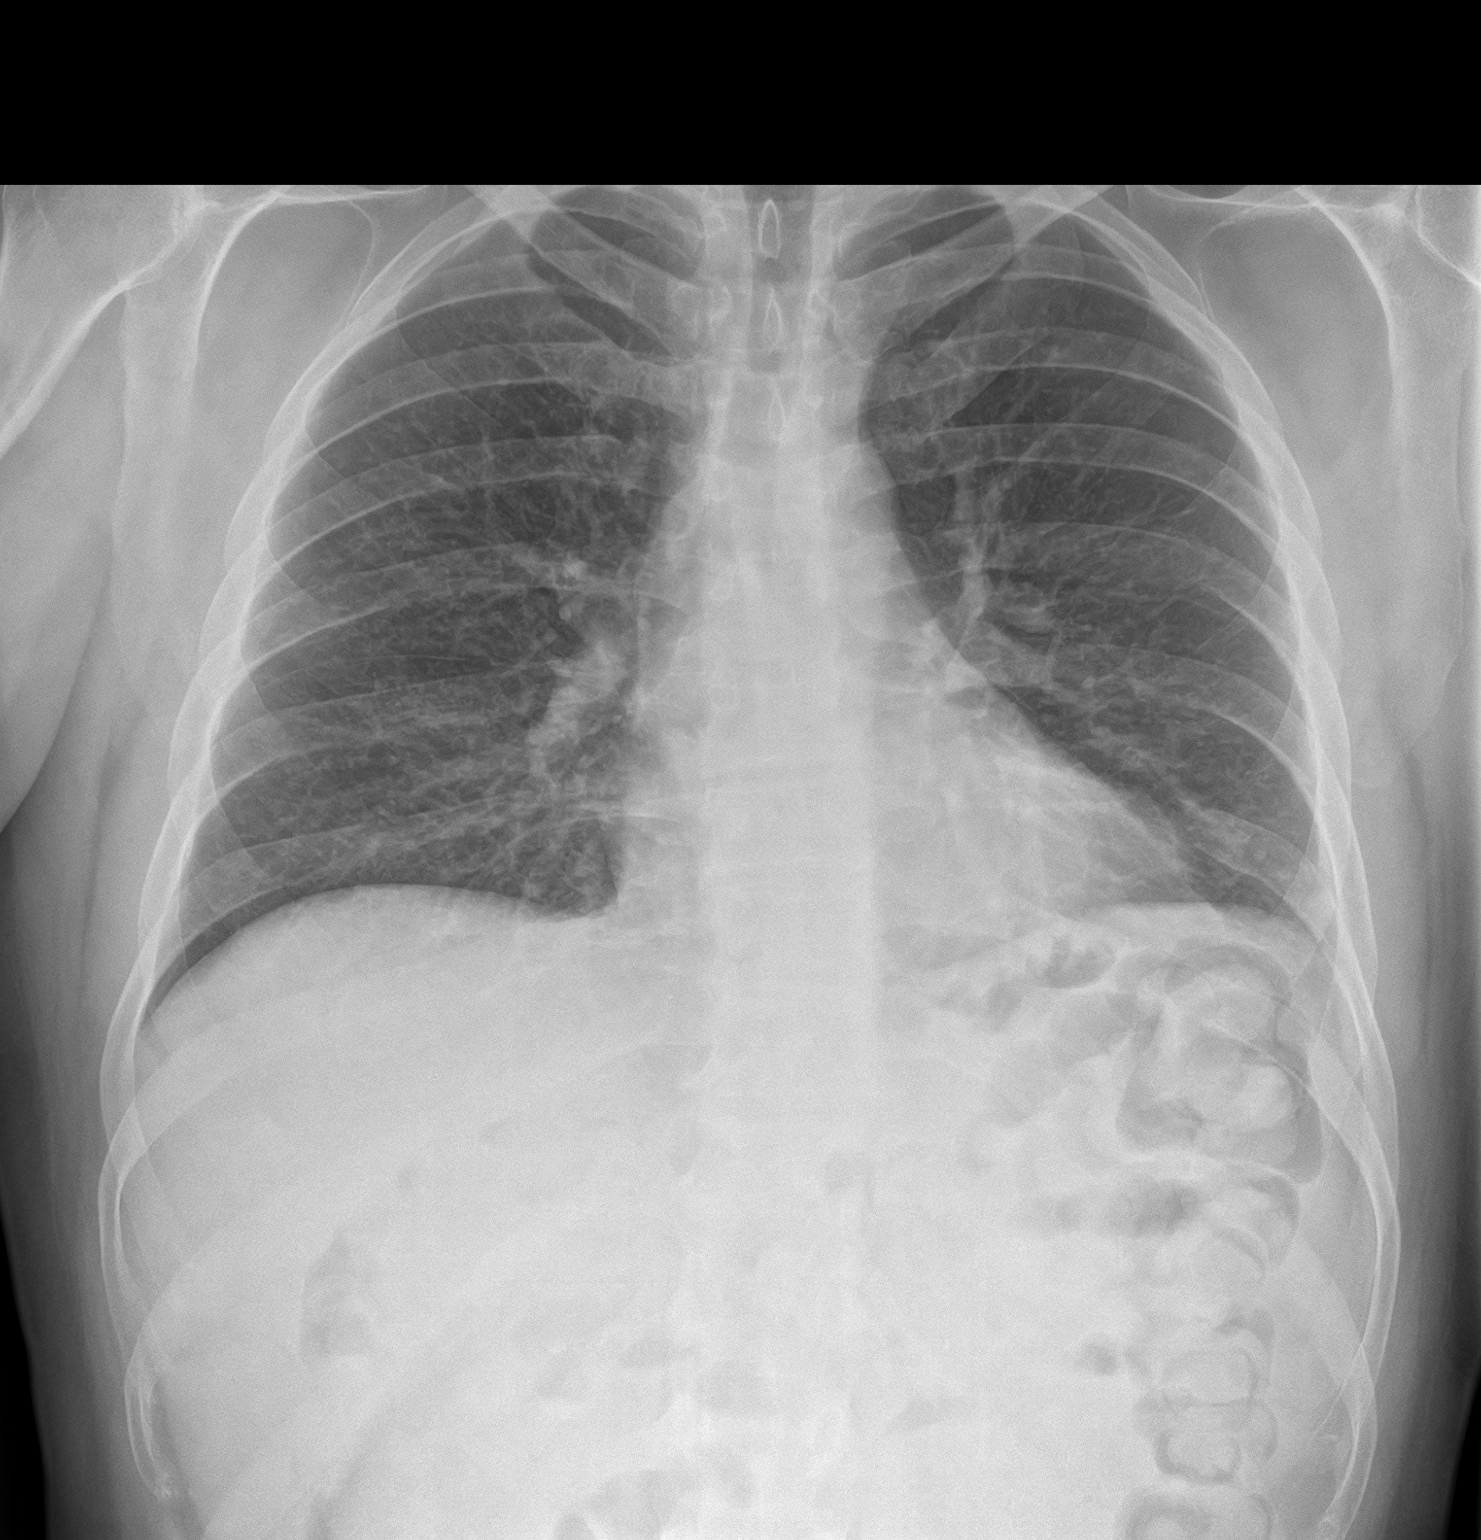
[im 2/2]
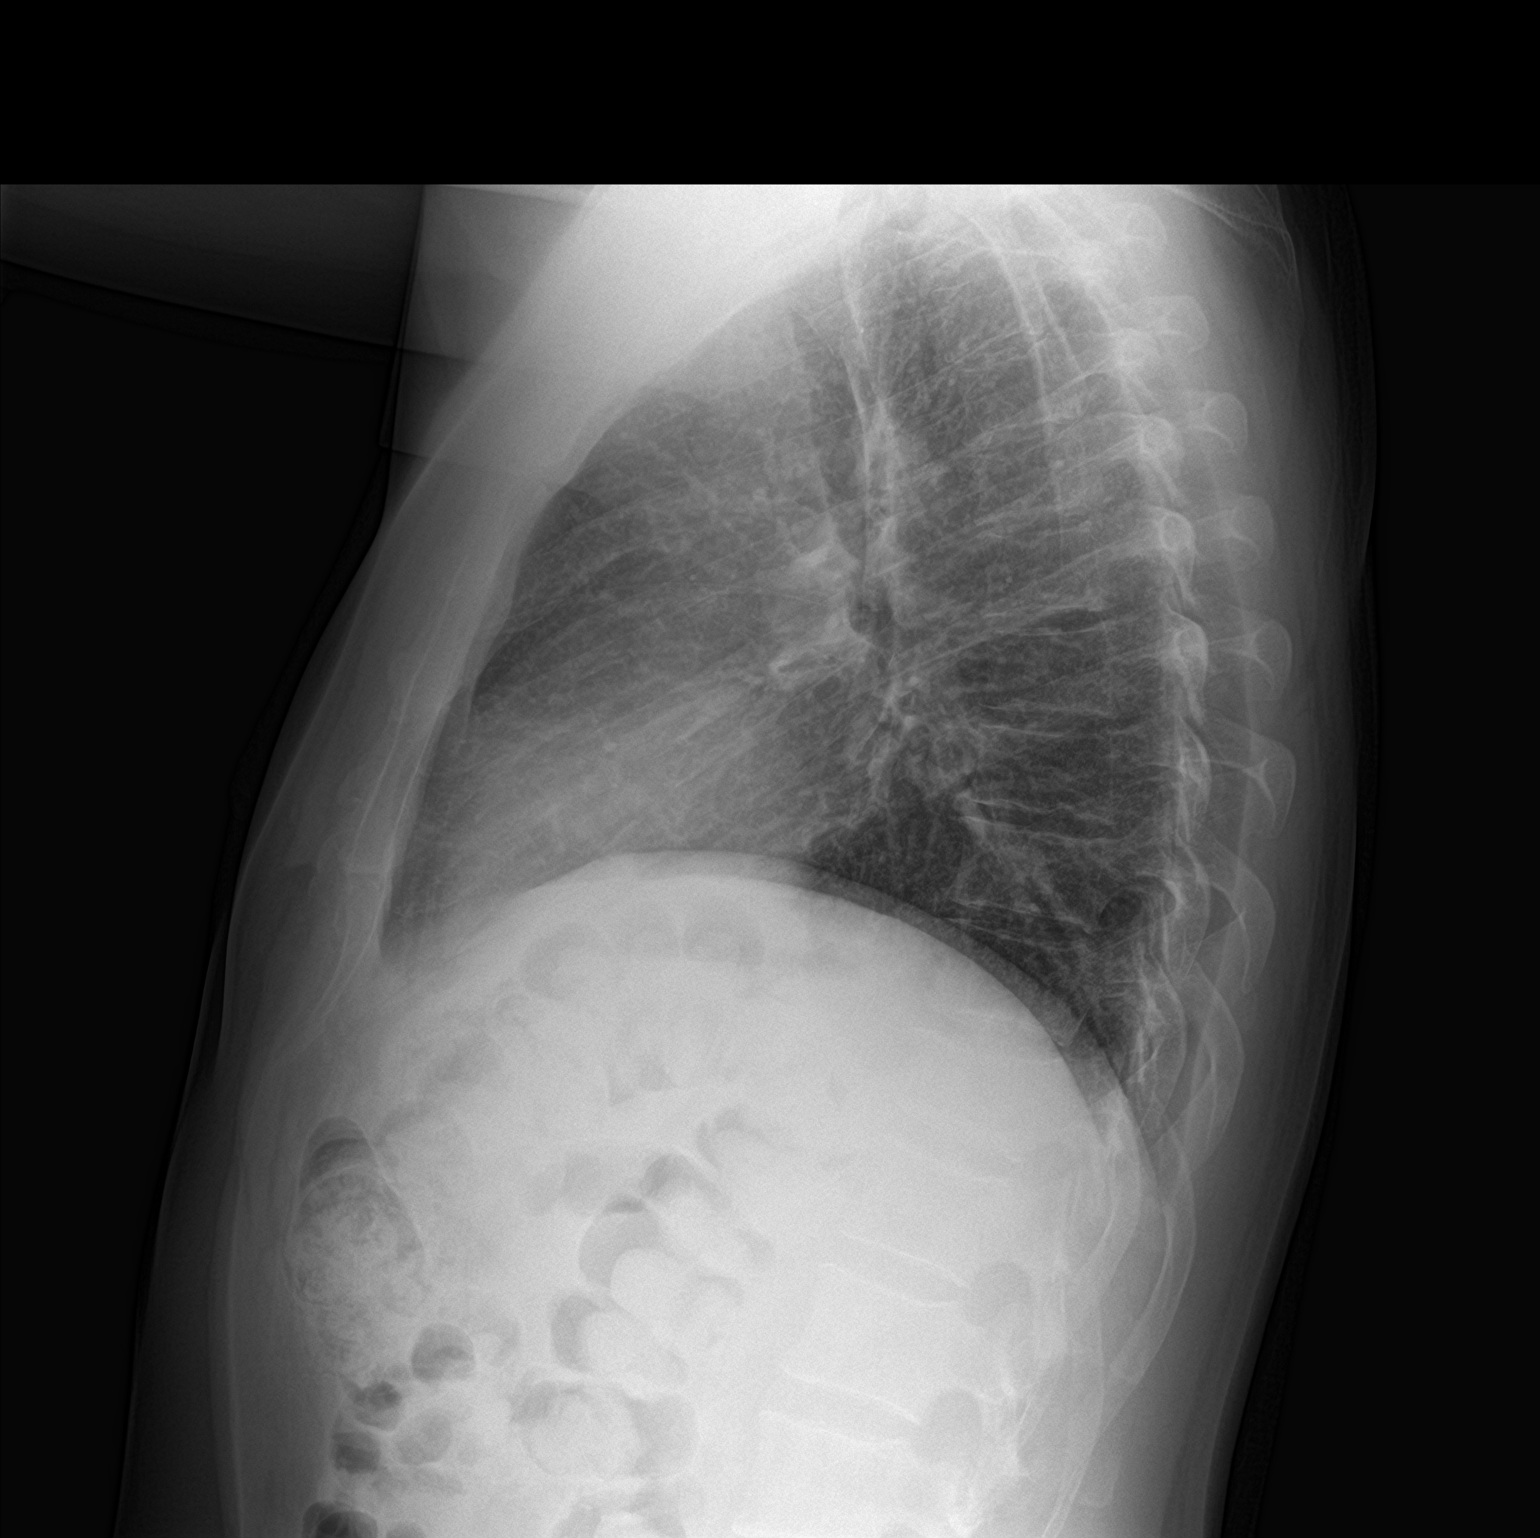

[2 of 2 positions shown; findings below may reference images not displayed]

FINDINGS: No acute opacity or pleural effusion. Normal heart size. No
pneumothorax. Possible subtle step-off deformity of the mid sternal
body.
IMPRESSION: 1. Negative for pleural effusion or pneumothorax
2. Possible subtle step-off deformity/fracture of the mid sternal
body. Could attempt dedicated views of the sternum.
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
## 2020-10-25 DEATH — deceased

## 2021-04-22 ENCOUNTER — Encounter: Payer: Self-pay | Admitting: *Deleted

## 2021-04-22 ENCOUNTER — Emergency Department: Payer: Medicaid Other

## 2021-04-22 ENCOUNTER — Emergency Department
Admission: EM | Admit: 2021-04-22 | Discharge: 2021-04-22 | Disposition: A | Discharge status: Home / Self Care | Payer: Medicaid Other | Attending: Emergency Medicine | Admitting: Emergency Medicine

## 2021-04-22 DIAGNOSIS — R519 Headache, unspecified: Secondary | ICD-10-CM | POA: Insufficient documentation

## 2021-04-22 DIAGNOSIS — R0981 Nasal congestion: Secondary | ICD-10-CM | POA: Insufficient documentation

## 2021-04-22 DIAGNOSIS — Z5321 Procedure and treatment not carried out due to patient leaving prior to being seen by health care provider: Secondary | ICD-10-CM | POA: Insufficient documentation

## 2021-04-22 DIAGNOSIS — Z20822 Contact with and (suspected) exposure to covid-19: Secondary | ICD-10-CM | POA: Insufficient documentation

## 2021-04-22 DIAGNOSIS — R059 Cough, unspecified: Secondary | ICD-10-CM | POA: Diagnosis not present

## 2021-04-22 DIAGNOSIS — R509 Fever, unspecified: Secondary | ICD-10-CM | POA: Diagnosis not present

## 2021-04-22 DIAGNOSIS — R079 Chest pain, unspecified: Secondary | ICD-10-CM | POA: Insufficient documentation

## 2021-04-22 LAB — BASIC METABOLIC PANEL
Anion gap: 6 (ref 5–15)
BUN: 9 mg/dL (ref 6–20)
CO2: 23 mmol/L (ref 22–32)
Calcium: 8.8 mg/dL — ABNORMAL LOW (ref 8.9–10.3)
Chloride: 109 mmol/L (ref 98–111)
Creatinine, Ser: 0.94 mg/dL (ref 0.61–1.24)
GFR, Estimated: >60 mL/min (ref >60)
Glucose, Bld: 100 mg/dL — ABNORMAL HIGH (ref 70–99)
Potassium: 3.7 mmol/L (ref 3.5–5.1)
Sodium: 138 mmol/L (ref 135–145)

## 2021-04-22 LAB — CBC
HCT: 43.3 % (ref 39.0–52.0)
Hemoglobin: 14.7 g/dL (ref 13.0–17.0)
MCH: 32 pg (ref 26.0–34.0)
MCHC: 33.9 g/dL (ref 30.0–36.0)
MCV: 94.1 fL (ref 80.0–100.0)
Platelets: 285 10*3/uL (ref 150–400)
RBC: 4.6 MIL/uL (ref 4.22–5.81)
RDW: 13.3 % (ref 11.5–15.5)
WBC: 10.2 10*3/uL (ref 4.0–10.5)
nRBC: 0 % (ref 0.0–0.2)

## 2021-04-22 LAB — RESP PANEL BY RT-PCR (FLU A&B, COVID) ARPGX2
Influenza A by PCR: POSITIVE — AB
Influenza B by PCR: NEGATIVE
SARS Coronavirus 2 by RT PCR: NEGATIVE

## 2021-04-22 LAB — TROPONIN I (HIGH SENSITIVITY): Troponin I (High Sensitivity): 6 ng/L (ref <18)

## 2021-04-22 NOTE — ED Notes (Signed)
No answer when called several times from lobby 

## 2021-04-22 NOTE — ED Triage Notes (Addendum)
Pt to ED reporting maxillofacial pain that has worsened today with increased pressure and congestion. Recent sinus surgery last week without complication to this point. Pt also reporting cough and new onset of chest pain and fever of 104 axillary. Tylenol taken at 2300 on 12/26.   Pt unsure of chest pain started after the cough.

## 2022-07-08 ENCOUNTER — Emergency Department
Admission: EM | Admit: 2022-07-08 | Discharge: 2022-07-08 | Disposition: A | Discharge status: Home / Self Care | Payer: Medicaid Other | Attending: Emergency Medicine | Admitting: Emergency Medicine

## 2022-07-08 ENCOUNTER — Other Ambulatory Visit: Payer: Self-pay

## 2022-07-08 ENCOUNTER — Emergency Department: Payer: Medicaid Other

## 2022-07-08 DIAGNOSIS — W1830XA Fall on same level, unspecified, initial encounter: Secondary | ICD-10-CM | POA: Diagnosis not present

## 2022-07-08 DIAGNOSIS — M25532 Pain in left wrist: Secondary | ICD-10-CM

## 2022-07-08 DIAGNOSIS — W19XXXA Unspecified fall, initial encounter: Secondary | ICD-10-CM

## 2022-07-08 MED ORDER — ACETAMINOPHEN 500 MG PO TABS
1000.0000 mg | ORAL_TABLET | Freq: Once | ORAL | Status: AC
  Administered 2022-07-08: 1000 mg via ORAL
  Filled 2022-07-08: qty 2

## 2022-07-08 MED ORDER — IBUPROFEN 400 MG PO TABS: 400.0000 mg | ORAL_TABLET | Freq: Four times a day (QID) | ORAL | 2 refills | Status: AC | PRN

## 2022-07-08 MED ORDER — ACETAMINOPHEN 500 MG PO TABS: 1000.0000 mg | ORAL_TABLET | Freq: Four times a day (QID) | ORAL | 2 refills | Status: AC | PRN

## 2022-07-08 NOTE — ED Triage Notes (Signed)
Pt to ED via POV c/o left wrist injury. Pt states he fell and stuck his hand out to break his fall. Pt having difficulty moving wrist.

## 2022-07-08 NOTE — ED Notes (Signed)
Upon giving patient his discharge instructions, patient inquired if he received a prescription for ibuprofen. Patient informed that no prescription has been sent and that he may acquire ibuprofen over the counter. Patient became abruptly agitated and jumped out of bed verbalizing frustration that he had been lied to. Patient stated that he could not afford prescription and that he was on disability and he would be able to obtain ibuprofen more readily if he had a prescription. Patient demanded that brace be put on his wrist as he did not know how. Patient continued to use profanity and angrily state that he had been lied to. Patient informed that this RN could ask for prescription to be sent to pharmacy and also informed that intimidating behavior would not be tolerated in ED. Patient asked to sit on stretcher. Patient complied and sat in stretcher and wrist brace placed on patient and patient educated on proper use. Patient verbalized and demonstrated understanding. Prescription sent to pharmacy by Dr. Jacelyn Grip as requested and patient notified. Patient ambulatory from ED with no distress noted.

## 2022-07-08 NOTE — Discharge Instructions (Addendum)
Take acetaminophen 650 mg and ibuprofen 400 mg every 6 hours for pain.  Take with food.  Keep elevated, ice the wrist to help with pain and swelling.   Call orthopedist if you continue to have pain so they can recheck some x-rays and check for broken bones not detected on x-ray today.
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
## 2023-09-24 ENCOUNTER — Other Ambulatory Visit: Payer: Self-pay

## 2023-09-24 ENCOUNTER — Encounter (HOSPITAL_COMMUNITY): Payer: Self-pay | Admitting: Emergency Medicine

## 2023-09-24 ENCOUNTER — Emergency Department (HOSPITAL_COMMUNITY)

## 2023-09-24 ENCOUNTER — Emergency Department (HOSPITAL_COMMUNITY)
Admission: EM | Admit: 2023-09-24 | Discharge: 2023-09-24 | Disposition: A | Discharge status: Other Acute Inpatient Hospital | Attending: Emergency Medicine | Admitting: Emergency Medicine

## 2023-09-24 DIAGNOSIS — F17210 Nicotine dependence, cigarettes, uncomplicated: Secondary | ICD-10-CM | POA: Insufficient documentation

## 2023-09-24 DIAGNOSIS — Z7901 Long term (current) use of anticoagulants: Secondary | ICD-10-CM | POA: Insufficient documentation

## 2023-09-24 DIAGNOSIS — S02609A Fracture of mandible, unspecified, initial encounter for closed fracture: Secondary | ICD-10-CM | POA: Diagnosis not present

## 2023-09-24 DIAGNOSIS — Z743 Need for continuous supervision: Secondary | ICD-10-CM | POA: Diagnosis not present

## 2023-09-24 DIAGNOSIS — R609 Edema, unspecified: Secondary | ICD-10-CM | POA: Diagnosis not present

## 2023-09-24 DIAGNOSIS — S0993XA Unspecified injury of face, initial encounter: Secondary | ICD-10-CM | POA: Diagnosis present

## 2023-09-24 DIAGNOSIS — R001 Bradycardia, unspecified: Secondary | ICD-10-CM | POA: Diagnosis not present

## 2023-09-24 LAB — CBC
HCT: 41.5 % (ref 39.0–52.0)
Hemoglobin: 14.2 g/dL (ref 13.0–17.0)
MCH: 32.7 pg (ref 26.0–34.0)
MCHC: 34.2 g/dL (ref 30.0–36.0)
MCV: 95.6 fL (ref 80.0–100.0)
Platelets: 299 10*3/uL (ref 150–400)
RBC: 4.34 MIL/uL (ref 4.22–5.81)
RDW: 13.4 % (ref 11.5–15.5)
WBC: 20.7 10*3/uL — ABNORMAL HIGH (ref 4.0–10.5)
nRBC: 0 % (ref 0.0–0.2)

## 2023-09-24 LAB — COMPREHENSIVE METABOLIC PANEL WITH GFR
ALT: 16 U/L (ref 0–44)
AST: 20 U/L (ref 15–41)
Albumin: 3.6 g/dL (ref 3.5–5.0)
Alkaline Phosphatase: 58 U/L (ref 38–126)
Anion gap: 10 (ref 5–15)
BUN: 18 mg/dL (ref 6–20)
CO2: 18 mmol/L — ABNORMAL LOW (ref 22–32)
Calcium: 8.6 mg/dL — ABNORMAL LOW (ref 8.9–10.3)
Chloride: 113 mmol/L — ABNORMAL HIGH (ref 98–111)
Creatinine, Ser: 1.14 mg/dL (ref 0.61–1.24)
GFR, Estimated: >60 mL/min (ref >60)
Glucose, Bld: 102 mg/dL — ABNORMAL HIGH (ref 70–99)
Potassium: 4 mmol/L (ref 3.5–5.1)
Sodium: 141 mmol/L (ref 135–145)
Total Bilirubin: 0.5 mg/dL (ref 0.0–1.2)
Total Protein: 6 g/dL — ABNORMAL LOW (ref 6.5–8.1)

## 2023-09-24 LAB — ETHANOL: Alcohol, Ethyl (B): <15 mg/dL (ref <15)

## 2023-09-24 MED ORDER — HYDROMORPHONE HCL 1 MG/ML IJ SOLN
1.0000 mg | INTRAMUSCULAR | Status: DC | PRN
  Administered 2023-09-24 (×4): 1 mg via INTRAVENOUS
  Filled 2023-09-24 (×5): qty 1

## 2023-09-24 MED ORDER — HYDROMORPHONE HCL 1 MG/ML IJ SOLN
1.0000 mg | Freq: Once | INTRAMUSCULAR | Status: AC
  Administered 2023-09-24: 1 mg via INTRAVENOUS
  Filled 2023-09-24: qty 1

## 2023-09-24 MED ORDER — ONDANSETRON HCL 4 MG/2ML IJ SOLN
4.0000 mg | Freq: Once | INTRAMUSCULAR | Status: AC
  Administered 2023-09-24: 4 mg via INTRAVENOUS
  Filled 2023-09-24: qty 2

## 2023-09-24 MED ORDER — DIPHENHYDRAMINE HCL 50 MG/ML IJ SOLN
25.0000 mg | Freq: Once | INTRAMUSCULAR | Status: AC
  Administered 2023-09-24: 25 mg via INTRAVENOUS
  Filled 2023-09-24: qty 1

## 2023-09-24 MED ORDER — HYDROMORPHONE HCL 1 MG/ML IJ SOLN
0.5000 mg | Freq: Once | INTRAMUSCULAR | Status: AC | PRN
  Administered 2023-09-24: 0.5 mg via INTRAVENOUS
  Filled 2023-09-24: qty 1

## 2023-09-24 MED ORDER — KETAMINE HCL 50 MG/5ML IJ SOSY
0.3000 mg/kg | PREFILLED_SYRINGE | Freq: Once | INTRAMUSCULAR | Status: AC
  Administered 2023-09-24: 24 mg via INTRAVENOUS
  Filled 2023-09-24: qty 5

## 2023-09-24 MED ORDER — DROPERIDOL 2.5 MG/ML IJ SOLN
1.2500 mg | Freq: Once | INTRAMUSCULAR | Status: AC
  Administered 2023-09-24: 1.25 mg via INTRAVENOUS
  Filled 2023-09-24: qty 2

## 2023-09-24 MED ORDER — KETAMINE HCL 50 MG/5ML IJ SOSY
20.0000 mg | PREFILLED_SYRINGE | Freq: Once | INTRAMUSCULAR | Status: AC
  Administered 2023-09-24: 20 mg via INTRAVENOUS
  Filled 2023-09-24: qty 5

## 2023-09-24 NOTE — ED Notes (Signed)
 Patients mother is upset asking for the provider, wants additional pain medication administered, states " since this hospital doesn't have the capabilities to fix him, we needed to give him pain medicine more often, I want him transferred to [facility] now, they will give him more pain medicine."  Advised, I have worked there and they may not give more pain medicine they what we are providing,  this comment made the mother angrier. Inga Manges, MD notified.
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
## 2023-09-24 NOTE — ED Notes (Signed)
 EDP at bedside

## 2023-09-24 NOTE — ED Notes (Signed)
 Patients mother requested pillow, one was provided.

## 2023-09-24 NOTE — ED Notes (Signed)
 Patient transported to CT

## 2023-09-24 NOTE — ED Notes (Signed)
 [facility] called for Dr Inga Manges

## 2023-10-11 MED FILL — Fentanyl Citrate Preservative Free (PF) Inj 100 MCG/2ML: INTRAMUSCULAR | Qty: 2 | Status: AC

## 2024-01-26 ENCOUNTER — Emergency Department (HOSPITAL_COMMUNITY)

## 2024-01-26 ENCOUNTER — Encounter (HOSPITAL_COMMUNITY): Payer: Self-pay | Admitting: Emergency Medicine

## 2024-01-26 ENCOUNTER — Emergency Department (HOSPITAL_COMMUNITY)
Admission: EM | Admit: 2024-01-26 | Discharge: 2024-01-26 | Disposition: A | Discharge status: Home / Self Care | Attending: Emergency Medicine | Admitting: Emergency Medicine

## 2024-01-26 ENCOUNTER — Other Ambulatory Visit: Payer: Self-pay

## 2024-01-26 DIAGNOSIS — R519 Headache, unspecified: Secondary | ICD-10-CM | POA: Insufficient documentation

## 2024-01-26 LAB — CBC WITH DIFFERENTIAL/PLATELET
Abs Immature Granulocytes: 0.07 K/uL (ref 0.00–0.07)
Basophils Absolute: 0.1 K/uL (ref 0.0–0.1)
Basophils Relative: 0 %
Eosinophils Absolute: 0.4 K/uL (ref 0.0–0.5)
Eosinophils Relative: 3 %
HCT: 43.4 % (ref 39.0–52.0)
Hemoglobin: 14.5 g/dL (ref 13.0–17.0)
Immature Granulocytes: 0 %
Lymphocytes Relative: 16 %
Lymphs Abs: 2.6 K/uL (ref 0.7–4.0)
MCH: 31.9 pg (ref 26.0–34.0)
MCHC: 33.4 g/dL (ref 30.0–36.0)
MCV: 95.4 fL (ref 80.0–100.0)
Monocytes Absolute: 1 K/uL (ref 0.1–1.0)
Monocytes Relative: 6 %
Neutro Abs: 12.1 K/uL — ABNORMAL HIGH (ref 1.7–7.7)
Neutrophils Relative %: 75 %
Platelets: 315 K/uL (ref 150–400)
RBC: 4.55 MIL/uL (ref 4.22–5.81)
RDW: 13.8 % (ref 11.5–15.5)
WBC: 16.3 K/uL — ABNORMAL HIGH (ref 4.0–10.5)
nRBC: 0 % (ref 0.0–0.2)

## 2024-01-26 LAB — COMPREHENSIVE METABOLIC PANEL WITH GFR
ALT: 20 U/L (ref 0–44)
AST: 24 U/L (ref 15–41)
Albumin: 3.8 g/dL (ref 3.5–5.0)
Alkaline Phosphatase: 76 U/L (ref 38–126)
Anion gap: 11 (ref 5–15)
BUN: 11 mg/dL (ref 6–20)
CO2: 21 mmol/L — ABNORMAL LOW (ref 22–32)
Calcium: 8.7 mg/dL — ABNORMAL LOW (ref 8.9–10.3)
Chloride: 104 mmol/L (ref 98–111)
Creatinine, Ser: 0.9 mg/dL (ref 0.61–1.24)
GFR, Estimated: >60 mL/min (ref >60)
Glucose, Bld: 186 mg/dL — ABNORMAL HIGH (ref 70–99)
Potassium: 3.8 mmol/L (ref 3.5–5.1)
Sodium: 136 mmol/L (ref 135–145)
Total Bilirubin: 0.4 mg/dL (ref 0.0–1.2)
Total Protein: 6.5 g/dL (ref 6.5–8.1)

## 2024-01-26 MED ORDER — FENTANYL CITRATE PF 50 MCG/ML IJ SOSY
100.0000 ug | PREFILLED_SYRINGE | Freq: Once | INTRAMUSCULAR | Status: AC
  Administered 2024-01-26: 100 ug via INTRAVENOUS
  Filled 2024-01-26: qty 2

## 2024-01-26 MED ORDER — OXYCODONE HCL 5 MG PO TABS
5.0000 mg | ORAL_TABLET | Freq: Once | ORAL | Status: AC
  Administered 2024-01-26: 5 mg via ORAL
  Filled 2024-01-26: qty 1

## 2024-01-26 MED ORDER — SODIUM CHLORIDE 0.9 % IV SOLN
3.0000 g | Freq: Once | INTRAVENOUS | Status: AC
  Administered 2024-01-26: 3 g via INTRAVENOUS
  Filled 2024-01-26: qty 8

## 2024-01-26 MED ORDER — AMOXICILLIN-POT CLAVULANATE 875-125 MG PO TABS: 1.0000 | ORAL_TABLET | Freq: Two times a day (BID) | ORAL | 0 refills | Status: DC

## 2024-01-26 MED ORDER — OXYCODONE HCL 5 MG PO TABS: 5.0000 mg | ORAL_TABLET | Freq: Four times a day (QID) | ORAL | 0 refills | Status: DC | PRN

## 2024-01-26 MED ORDER — IOHEXOL 350 MG/ML SOLN
75.0000 mL | Freq: Once | INTRAVENOUS | Status: AC | PRN
  Administered 2024-01-26: 75 mL via INTRAVENOUS
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
## 2024-01-26 NOTE — ED Notes (Signed)
 Patient discharged to home with self to lobby to wait for friend. Patent informed not to drive. Patient verbalized understanding. Patient given written and oral discharge instructions. Patient verbalized understanding. Patient ambulatory to exit with steady gait. Patient breathing without difficulty. Patient denies questions, concerns or needs at this time.
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
## 2024-02-15 ENCOUNTER — Other Ambulatory Visit: Payer: Self-pay | Admitting: Otolaryngology

## 2024-02-21 NOTE — Pre-Procedure Instructions (Signed)
 Surgical Instructions   Your procedure is scheduled on February 25, 2024. Report to [facility] Main Entrance A at 10:15 A.M., then check in with the Admitting office. Any questions or running late day of surgery: call [phone]  Questions prior to your surgery date: call [phone], Monday-Friday, 8am-4pm. If you experience any cold or flu symptoms such as cough, fever, chills, shortness of breath, etc. between now and your scheduled surgery, please notify us  at the above number.     Remember:  Do not eat after midnight the night before your surgery   You may drink clear liquids until 9:15 AM the morning of your surgery.   Clear liquids allowed are: Water, Non-Citrus Juices (without pulp), Carbonated Beverages, Clear Tea (no milk, honey, etc.), Black Coffee Only (NO MILK, CREAM OR POWDERED CREAMER of any kind), and Gatorade.    Take these medicines the morning of surgery with A SIP OF WATER: ALPRAZolam (XANAX)  gabapentin (NEURONTIN)  Oxycodone  HCl    May take these medicines IF NEEDED: methocarbamol (ROBAXIN)  topiramate (TOPAMAX)    Follow your surgeon's instructions on when to stop apixaban (ELIQUIS).  If no instructions were given by your surgeon then you will need to call the office to get those instructions.     One week prior to surgery, STOP taking any Aspirin (unless otherwise instructed by your surgeon) Aleve, Naproxen, Ibuprofen , Motrin , Advil , Goody's, BC's, all herbal medications, fish oil, and non-prescription vitamins.                     Do NOT Smoke (Tobacco/Vaping) for 24 hours prior to your procedure.  If you use a CPAP at night, you may bring your mask/headgear for your overnight stay.   You will be asked to remove any contacts, glasses, piercing's, hearing aid's, dentures/partials prior to surgery. Please bring cases for these items if needed.    Patients discharged the day of surgery will not be allowed to drive home, and someone needs to stay  with them for 24 hours.  SURGICAL WAITING ROOM VISITATION Patients may have no more than 2 support people in the waiting area - these visitors may rotate.   Pre-op nurse will coordinate an appropriate time for 1 ADULT support person, who may not rotate, to accompany patient in pre-op.  Children under the age of 42 must have an adult with them who is not the patient and must remain in the main waiting area with an adult.  If the patient needs to stay at the hospital during part of their recovery, the visitor guidelines for inpatient rooms apply.  Please refer to the [facility] website for the visitor guidelines for any additional information.   If you received a COVID test during your pre-op visit  it is requested that you wear a mask when out in public, stay away from anyone that may not be feeling well and notify your surgeon if you develop symptoms. If you have been in contact with anyone that has tested positive in the last 10 days please notify you surgeon.      Pre-operative CHG Bathing Instructions   You can play a key role in reducing the risk of infection after surgery. Your skin needs to be as free of germs as possible. You can reduce the number of germs on your skin by washing with CHG (chlorhexidine gluconate) soap before surgery. CHG is an antiseptic soap that kills germs and continues to kill germs even after washing.  DO NOT use if you have an allergy to chlorhexidine/CHG or antibacterial soaps. If your skin becomes reddened or irritated, stop using the CHG and notify one of our RNs at [phone].              TAKE A SHOWER THE NIGHT BEFORE SURGERY   Please keep in mind the following:  DO NOT shave, including legs and underarms, 48 hours prior to surgery.   You may shave your face before/day of surgery.  Place clean sheets on your bed the night before surgery Use a clean washcloth (not used since being washed) for shower. DO NOT sleep with pet's night before  surgery.  CHG Shower Instructions:  Wash your face and private area with normal soap. If you choose to wash your hair, wash first with your normal shampoo.  After you use shampoo/soap, rinse your hair and body thoroughly to remove shampoo/soap residue.  Turn the water OFF and apply half the bottle of CHG soap to a CLEAN washcloth.  Apply CHG soap ONLY FROM YOUR NECK DOWN TO YOUR TOES (washing for 3-5 minutes)  DO NOT use CHG soap on face, private areas, open wounds, or sores.  Pay special attention to the area where your surgery is being performed.  If you are having back surgery, having someone wash your back for you may be helpful. Wait 2 minutes after CHG soap is applied, then you may rinse off the CHG soap.  Pat dry with a clean towel  Put on clean pajamas    Additional instructions for the day of surgery: If you choose, you may shower the morning of surgery with an antibacterial soap.  DO NOT APPLY any lotions, deodorants, cologne, or perfumes.   Do not wear jewelry or makeup Do not wear nail polish, gel polish, artificial nails, or any other type of covering on natural nails (fingers and toes) Do not bring valuables to the hospital. [facility] is not responsible for valuables/personal belongings. Put on clean/comfortable clothes.  Please brush your teeth.  Ask your nurse before applying any prescription medications to the skin.

## 2024-02-22 ENCOUNTER — Encounter (HOSPITAL_COMMUNITY): Payer: Self-pay

## 2024-02-22 ENCOUNTER — Other Ambulatory Visit: Payer: Self-pay

## 2024-02-22 ENCOUNTER — Encounter (HOSPITAL_COMMUNITY)
Admission: RE | Admit: 2024-02-22 | Discharge: 2024-02-22 | Disposition: A | Discharge status: Home / Self Care | Source: Ambulatory Visit

## 2024-02-22 DIAGNOSIS — Z01818 Encounter for other preprocedural examination: Secondary | ICD-10-CM | POA: Insufficient documentation

## 2024-02-22 HISTORY — DX: Acute embolism and thrombosis of unspecified deep veins of unspecified lower extremity: I82.409

## 2024-02-22 NOTE — Progress Notes (Signed)
 PCP - Dorena Fernando HERO, MD Cardiologist - denies Denies having neurologist  PPM/ICD - denies   Chest x-ray - 01/26/24 EKG - 09/24/23 Stress Test - denies ECHO - denies Cardiac Cath - denies  Sleep Study - reports negative sleep study at some point   Fasting Blood Sugar - N/A  Last dose of GLP1 agonist-  n/a GLP1 instructions: n/a  Blood Thinner Instructions: Pt reports that he was not given any instructions on stopping Eliquis, but he stopped it after his evening dose on 02/20/24.  Aspirin Instructions: Pt did take BC 849mg  this morning.  I left a voicemail with Dr Vella surgery scheduler to notify MD of patient's last dose of Eliquis and the BC powder.  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 Pt denies having neurologist, but says that he was taken of Dilantin a long time ago. Pt reports no seizure in over a year. Pt reports he has not been taking lasix prn d/t not having swelling since he can no longer eat well with his mandible issues. Pt reports he was eating junk food. Pt with history of blood clots in his legs (on Eliquis). Pt states that he has been having brain fog d/t issues with his jaw, and may not be able to tell me everything about his medical history. Requested last office note from PCP  Patient denies shortness of breath, fever, cough and chest pain at PAT appointment   All instructions explained to the patient, with a verbal understanding of the material. Patient agrees to go over the instructions while at home for a better understanding. Patient also instructed to self quarantine after being tested for COVID-19. The opportunity to ask questions was provided.

## 2024-02-25 ENCOUNTER — Encounter (HOSPITAL_COMMUNITY): Payer: Self-pay | Admitting: Otolaryngology

## 2024-02-25 ENCOUNTER — Encounter (HOSPITAL_COMMUNITY)
Admission: RE | Disposition: A | Discharge status: Home / Self Care | Payer: Self-pay | Source: Home / Self Care | Attending: Otolaryngology

## 2024-02-25 ENCOUNTER — Encounter (HOSPITAL_COMMUNITY): Payer: Self-pay | Admitting: Vascular Surgery

## 2024-02-25 ENCOUNTER — Ambulatory Visit (HOSPITAL_COMMUNITY): Admitting: Certified Registered Nurse Anesthetist

## 2024-02-25 ENCOUNTER — Other Ambulatory Visit: Payer: Self-pay

## 2024-02-25 ENCOUNTER — Observation Stay (HOSPITAL_COMMUNITY)
Admission: RE | Admit: 2024-02-25 | Discharge: 2024-02-26 | Disposition: A | Discharge status: Home / Self Care | Attending: Otolaryngology | Admitting: Otolaryngology

## 2024-02-25 DIAGNOSIS — S02602S Fracture of unspecified part of body of left mandible, sequela: Secondary | ICD-10-CM | POA: Diagnosis not present

## 2024-02-25 DIAGNOSIS — Z8781 Personal history of (healed) traumatic fracture: Secondary | ICD-10-CM | POA: Diagnosis not present

## 2024-02-25 DIAGNOSIS — Z7901 Long term (current) use of anticoagulants: Secondary | ICD-10-CM | POA: Diagnosis not present

## 2024-02-25 DIAGNOSIS — F17210 Nicotine dependence, cigarettes, uncomplicated: Secondary | ICD-10-CM | POA: Insufficient documentation

## 2024-02-25 DIAGNOSIS — T847XXD Infection and inflammatory reaction due to other internal orthopedic prosthetic devices, implants and grafts, subsequent encounter: Secondary | ICD-10-CM | POA: Diagnosis not present

## 2024-02-25 DIAGNOSIS — T847XXS Infection and inflammatory reaction due to other internal orthopedic prosthetic devices, implants and grafts, sequela: Principal | ICD-10-CM

## 2024-02-25 DIAGNOSIS — X58XXXD Exposure to other specified factors, subsequent encounter: Secondary | ICD-10-CM | POA: Diagnosis not present

## 2024-02-25 DIAGNOSIS — T8460XD Infection and inflammatory reaction due to internal fixation device of unspecified site, subsequent encounter: Principal | ICD-10-CM | POA: Insufficient documentation

## 2024-02-25 HISTORY — PX: ORIF MANDIBULAR FRACTURE: SHX2127

## 2024-02-25 SURGERY — OPEN REDUCTION INTERNAL FIXATION (ORIF) MANDIBULAR FRACTURE
Anesthesia: General | Site: Neck | Laterality: Left

## 2024-02-25 MED ORDER — HYDROMORPHONE HCL 1 MG/ML IJ SOLN
INTRAMUSCULAR | Status: DC | PRN
  Administered 2024-02-25 (×3): .5 mg via INTRAVENOUS

## 2024-02-25 MED ORDER — METHOCARBAMOL 500 MG PO TABS
500.0000 mg | ORAL_TABLET | Freq: Two times a day (BID) | ORAL | Status: DC | PRN
  Administered 2024-02-25: 500 mg via ORAL
  Filled 2024-02-25 (×2): qty 1

## 2024-02-25 MED ORDER — SUGAMMADEX SODIUM 200 MG/2ML IV SOLN
INTRAVENOUS | Status: DC | PRN
Start: 2024-02-25 — End: 2024-02-25
  Administered 2024-02-25: 200 mg via INTRAVENOUS

## 2024-02-25 MED ORDER — AMISULPRIDE (ANTIEMETIC) 5 MG/2ML IV SOLN: 10.0000 mg | Freq: Once | INTRAVENOUS | Status: DC | PRN

## 2024-02-25 MED ORDER — HYDROMORPHONE HCL 1 MG/ML IJ SOLN
INTRAMUSCULAR | Status: AC
  Filled 2024-02-25: qty 0.5

## 2024-02-25 MED ORDER — KETAMINE HCL 50 MG/5ML IJ SOSY
PREFILLED_SYRINGE | INTRAMUSCULAR | Status: AC
  Filled 2024-02-25: qty 5

## 2024-02-25 MED ORDER — POLYETHYLENE GLYCOL 3350 17 G PO PACK
17.0000 g | PACK | Freq: Every day | ORAL | Status: DC | PRN
Start: 2024-02-25 — End: 2024-02-26

## 2024-02-25 MED ORDER — ORAL CARE MOUTH RINSE: 15.0000 mL | Freq: Once | OROMUCOSAL | Status: AC

## 2024-02-25 MED ORDER — KETAMINE HCL 50 MG/5ML IJ SOSY
PREFILLED_SYRINGE | INTRAMUSCULAR | Status: DC | PRN
  Administered 2024-02-25: 30 mg via INTRAVENOUS
  Administered 2024-02-25: 20 mg via INTRAVENOUS

## 2024-02-25 MED ORDER — LIDOCAINE-EPINEPHRINE 1 %-1:100000 IJ SOLN
INTRAMUSCULAR | Status: DC | PRN
  Administered 2024-02-25: 10 mL

## 2024-02-25 MED ORDER — PHENYLEPHRINE HCL-NACL 20-0.9 MG/250ML-% IV SOLN
INTRAVENOUS | Status: DC | PRN
  Administered 2024-02-25: 25 ug/min via INTRAVENOUS

## 2024-02-25 MED ORDER — MIDAZOLAM HCL 2 MG/2ML IJ SOLN
INTRAMUSCULAR | Status: AC
  Filled 2024-02-25: qty 2

## 2024-02-25 MED ORDER — FENTANYL CITRATE (PF) 250 MCG/5ML IJ SOLN
INTRAMUSCULAR | Status: DC | PRN
  Administered 2024-02-25 (×2): 50 ug via INTRAVENOUS
  Administered 2024-02-25: 150 ug via INTRAVENOUS

## 2024-02-25 MED ORDER — 0.9 % SODIUM CHLORIDE (POUR BTL) OPTIME
TOPICAL | Status: DC | PRN
Start: 2024-02-25 — End: 2024-02-25
  Administered 2024-02-25: 1000 mL

## 2024-02-25 MED ORDER — IBUPROFEN 600 MG PO TABS
600.0000 mg | ORAL_TABLET | Freq: Four times a day (QID) | ORAL | Status: DC
  Administered 2024-02-25 – 2024-02-26 (×2): 600 mg via ORAL
  Filled 2024-02-25 (×3): qty 1

## 2024-02-25 MED ORDER — LACTATED RINGERS IV SOLN: INTRAVENOUS | Status: DC

## 2024-02-25 MED ORDER — PROPOFOL 10 MG/ML IV BOLUS
INTRAVENOUS | Status: AC
Start: 2024-02-25 — End: 2024-02-25
  Filled 2024-02-25: qty 20

## 2024-02-25 MED ORDER — ACETAMINOPHEN 10 MG/ML IV SOLN: 1000.0000 mg | Freq: Once | INTRAVENOUS | Status: DC | PRN

## 2024-02-25 MED ORDER — CHLORHEXIDINE GLUCONATE 0.12 % MT SOLN
15.0000 mL | Freq: Once | OROMUCOSAL | Status: AC
  Administered 2024-02-25: 15 mL via OROMUCOSAL
  Filled 2024-02-25: qty 15

## 2024-02-25 MED ORDER — BACITRACIN ZINC 500 UNIT/GM EX OINT
TOPICAL_OINTMENT | CUTANEOUS | Status: DC | PRN
  Administered 2024-02-25: 1 via TOPICAL

## 2024-02-25 MED ORDER — FENTANYL CITRATE (PF) 100 MCG/2ML IJ SOLN
25.0000 ug | INTRAMUSCULAR | Status: DC | PRN
  Administered 2024-02-25: 50 ug via INTRAVENOUS

## 2024-02-25 MED ORDER — DEXMEDETOMIDINE HCL IN NACL 80 MCG/20ML IV SOLN
INTRAVENOUS | Status: DC | PRN
Start: 2024-02-25 — End: 2024-02-25
  Administered 2024-02-25: 20 ug via INTRAVENOUS

## 2024-02-25 MED ORDER — HYDROMORPHONE HCL 1 MG/ML IJ SOLN
1.0000 mg | INTRAMUSCULAR | Status: DC | PRN
  Administered 2024-02-25 – 2024-02-26 (×7): 1 mg via INTRAVENOUS
  Filled 2024-02-25 (×7): qty 1

## 2024-02-25 MED ORDER — CEFAZOLIN SODIUM-DEXTROSE 2-4 GM/100ML-% IV SOLN
INTRAVENOUS | Status: AC
  Filled 2024-02-25: qty 100

## 2024-02-25 MED ORDER — MIDAZOLAM HCL (PF) 2 MG/2ML IJ SOLN
INTRAMUSCULAR | Status: DC | PRN
  Administered 2024-02-25 (×2): 2 mg via INTRAVENOUS

## 2024-02-25 MED ORDER — OXYCODONE HCL 5 MG PO TABS
5.0000 mg | ORAL_TABLET | ORAL | Status: DC | PRN
Start: 2024-02-25 — End: 2024-02-26
  Administered 2024-02-25 – 2024-02-26 (×3): 5 mg via ORAL
  Filled 2024-02-25 (×3): qty 1

## 2024-02-25 MED ORDER — ROCURONIUM BROMIDE 10 MG/ML (PF) SYRINGE
PREFILLED_SYRINGE | INTRAVENOUS | Status: DC | PRN
Start: 2024-02-25 — End: 2024-02-25
  Administered 2024-02-25: 20 mg via INTRAVENOUS
  Administered 2024-02-25: 50 mg via INTRAVENOUS
  Administered 2024-02-25: 60 mg via INTRAVENOUS
  Administered 2024-02-25: 10 mg via INTRAVENOUS
  Administered 2024-02-25: 20 mg via INTRAVENOUS
  Administered 2024-02-25: 40 mg via INTRAVENOUS

## 2024-02-25 MED ORDER — FENTANYL CITRATE (PF) 100 MCG/2ML IJ SOLN
INTRAMUSCULAR | Status: AC
  Filled 2024-02-25: qty 2

## 2024-02-25 MED ORDER — LIDOCAINE 2% (20 MG/ML) 5 ML SYRINGE
INTRAMUSCULAR | Status: DC | PRN
  Administered 2024-02-25: 60 mg via INTRAVENOUS

## 2024-02-25 MED ORDER — OXYMETAZOLINE HCL 0.05 % NA SOLN
NASAL | Status: DC | PRN
  Administered 2024-02-25 (×2): 2 via NASAL

## 2024-02-25 MED ORDER — OXYCODONE HCL 5 MG PO TABS
5.0000 mg | ORAL_TABLET | Freq: Once | ORAL | Status: AC | PRN
  Administered 2024-02-25: 5 mg via ORAL

## 2024-02-25 MED ORDER — CEFAZOLIN SODIUM-DEXTROSE 2-4 GM/100ML-% IV SOLN
2.0000 g | INTRAVENOUS | Status: AC
  Administered 2024-02-25: 2 g via INTRAVENOUS

## 2024-02-25 MED ORDER — AMOXICILLIN-POT CLAVULANATE 875-125 MG PO TABS
1.0000 | ORAL_TABLET | Freq: Two times a day (BID) | ORAL | Status: DC
  Administered 2024-02-25: 1 via ORAL
  Filled 2024-02-25: qty 1

## 2024-02-25 MED ORDER — OXYCODONE HCL 5 MG/5ML PO SOLN: 5.0000 mg | Freq: Once | ORAL | Status: AC | PRN

## 2024-02-25 MED ORDER — ACETAMINOPHEN 325 MG PO TABS
650.0000 mg | ORAL_TABLET | Freq: Four times a day (QID) | ORAL | Status: DC
  Administered 2024-02-25 – 2024-02-26 (×2): 650 mg via ORAL
  Filled 2024-02-25 (×2): qty 2

## 2024-02-25 MED ORDER — OXYMETAZOLINE HCL 0.05 % NA SOLN
NASAL | Status: AC
  Filled 2024-02-25: qty 30

## 2024-02-25 MED ORDER — PROPOFOL 10 MG/ML IV BOLUS
INTRAVENOUS | Status: DC | PRN
  Administered 2024-02-25: 50 mg via INTRAVENOUS
  Administered 2024-02-25: 200 mg via INTRAVENOUS

## 2024-02-25 MED ORDER — GABAPENTIN 300 MG PO CAPS
300.0000 mg | ORAL_CAPSULE | Freq: Two times a day (BID) | ORAL | Status: DC
  Administered 2024-02-25: 300 mg via ORAL
  Filled 2024-02-25: qty 1

## 2024-02-25 MED ORDER — LIDOCAINE-EPINEPHRINE 1 %-1:100000 IJ SOLN
INTRAMUSCULAR | Status: AC
  Filled 2024-02-25: qty 1

## 2024-02-25 MED ORDER — OXYCODONE HCL 5 MG PO TABS
5.0000 mg | ORAL_TABLET | Freq: Four times a day (QID) | ORAL | Status: DC | PRN
Start: 2024-02-25 — End: 2024-02-25

## 2024-02-25 MED ORDER — OXYCODONE HCL 5 MG PO TABS
ORAL_TABLET | ORAL | Status: AC
  Filled 2024-02-25: qty 1

## 2024-02-25 MED ORDER — FENTANYL CITRATE (PF) 250 MCG/5ML IJ SOLN
INTRAMUSCULAR | Status: AC
  Filled 2024-02-25: qty 5

## 2024-02-25 MED ORDER — ONDANSETRON HCL 4 MG/2ML IJ SOLN
INTRAMUSCULAR | Status: DC | PRN
  Administered 2024-02-25: 4 mg via INTRAVENOUS

## 2024-02-25 MED ORDER — DEXAMETHASONE SOD PHOSPHATE PF 10 MG/ML IJ SOLN
INTRAMUSCULAR | Status: DC | PRN
Start: 2024-02-25 — End: 2024-02-25
  Administered 2024-02-25: 10 mg via INTRAVENOUS

## 2024-02-25 MED ORDER — DEXAMETHASONE SOD PHOSPHATE PF 10 MG/ML IJ SOLN
10.0000 mg | Freq: Three times a day (TID) | INTRAMUSCULAR | Status: DC
  Administered 2024-02-25 – 2024-02-26 (×2): 10 mg via INTRAVENOUS
  Filled 2024-02-25 (×3): qty 1

## 2024-02-25 MED ORDER — BACITRACIN ZINC 500 UNIT/GM EX OINT
TOPICAL_OINTMENT | CUTANEOUS | Status: AC
  Filled 2024-02-25: qty 28.35

## 2024-02-25 SURGICAL SUPPLY — 34 items
BLADE SURG 15 STRL LF DISP TIS (BLADE) IMPLANT
CANISTER SUCTION 3000ML PPV (SUCTIONS) ×2 IMPLANT
CLIP TI MEDIUM 24 (CLIP) IMPLANT
CLIP TI WIDE RED SMALL 24 (CLIP) IMPLANT
CORD BIPOLAR FORCEPS 12FT (ELECTRODE) IMPLANT
COVER SURGICAL LIGHT HANDLE (MISCELLANEOUS) ×2 IMPLANT
DRAIN PENROSE 0.5X18 (DRAIN) IMPLANT
ELECT COATED BLADE 2.86 ST (ELECTRODE) ×2 IMPLANT
ELECTRODE REM PT RTRN 9FT ADLT (ELECTROSURGICAL) ×2 IMPLANT
GAUZE SPONGE 4X4 12PLY STRL (GAUZE/BANDAGES/DRESSINGS) IMPLANT
GLOVE BIO SURGEON STRL SZ7.5 (GLOVE) ×2 IMPLANT
GLOVE BIOGEL PI IND STRL 8 (GLOVE) ×2 IMPLANT
GOWN STRL REUS W/ TWL LRG LVL3 (GOWN DISPOSABLE) ×4 IMPLANT
GOWN STRL REUS W/ TWL XL LVL3 (GOWN DISPOSABLE) ×2 IMPLANT
HOOK RETRACT STAY BLUNT 12 (MISCELLANEOUS) IMPLANT
IV CATH 14GX2 1/4 (CATHETERS) ×2 IMPLANT
KIT BASIN OR (CUSTOM PROCEDURE TRAY) ×2 IMPLANT
KIT TURNOVER KIT B (KITS) ×2 IMPLANT
MARKER SKIN DUAL TIP RULER LAB (MISCELLANEOUS) ×2 IMPLANT
NDL 27GX1/2 REG BEVEL ECLIP (NEEDLE) ×2 IMPLANT
NEEDLE 27GX1/2 REG BEVEL ECLIP (NEEDLE) ×1 IMPLANT
PAD ARMBOARD POSITIONER FOAM (MISCELLANEOUS) ×4 IMPLANT
PENCIL SMOKE EVACUATOR (MISCELLANEOUS) ×2 IMPLANT
SET WALTER ACTIVATION W/DRAPE (SET/KITS/TRAYS/PACK) IMPLANT
SOLN 0.9% NACL POUR BTL 1000ML (IV SOLUTION) ×2 IMPLANT
SOLN STERILE WATER BTL 1000 ML (IV SOLUTION) ×2 IMPLANT
SPONGE INTESTINAL PEANUT (DISPOSABLE) IMPLANT
SUT ETHILON 5 0 PS 2 18 (SUTURE) IMPLANT
SUT SILK 2 0 SH (SUTURE) IMPLANT
SUT SILK 3 0 REEL (SUTURE) IMPLANT
SUT SILK 3 0 SH 30 (SUTURE) IMPLANT
SUT VIC AB 3-0 SH 8-18 (SUTURE) IMPLANT
TOWEL GREEN STERILE FF (TOWEL DISPOSABLE) ×2 IMPLANT
TRAY ENT MC OR (CUSTOM PROCEDURE TRAY) ×2 IMPLANT

## 2024-02-25 NOTE — Plan of Care (Signed)
 Patient came from PACU, on 10/10 pain. PRN meds given. Patient has been irritable after he came from surgery wanting pain meds and talk to the provider. Paged provider and aware of the situation. Family at bedside. VS taken and recorded. Needs attended. Call bell within reached.  Problem: Education: Goal: Knowledge of General Education information will improve Description: Including pain rating scale, medication(s)/side effects and non-pharmacologic comfort measures Outcome: Progressing   Problem: Health Behavior/Discharge Planning: Goal: Ability to manage health-related needs will improve Outcome: Progressing   Problem: Clinical Measurements: Goal: Ability to maintain clinical measurements within normal limits will improve Outcome: Progressing Goal: Will remain free from infection Outcome: Progressing Goal: Diagnostic test results will improve Outcome: Progressing Goal: Respiratory complications will improve Outcome: Progressing Goal: Cardiovascular complication will be avoided Outcome: Progressing   Problem: Activity: Goal: Risk for activity intolerance will decrease Outcome: Progressing   Problem: Nutrition: Goal: Adequate nutrition will be maintained Outcome: Progressing   Problem: Coping: Goal: Level of anxiety will decrease Outcome: Progressing   Problem: Elimination: Goal: Will not experience complications related to bowel motility Outcome: Progressing Goal: Will not experience complications related to urinary retention Outcome: Progressing   Problem: Pain Managment: Goal: General experience of comfort will improve and/or be controlled Outcome: Progressing   Problem: Safety: Goal: Ability to remain free from injury will improve Outcome: Progressing   Problem: Skin Integrity: Goal: Risk for impaired skin integrity will decrease Outcome: Progressing

## 2024-02-25 NOTE — Anesthesia Postprocedure Evaluation (Signed)
 Anesthesia Post Note  Patient: Tyler Bowman  Procedure(s) Performed: REMOVAL OF DEEP LEFT MANDIBULAR HARDWARE (Left: Neck)     Patient location during evaluation: PACU Anesthesia Type: General Level of consciousness: awake and alert Pain management: pain level controlled Vital Signs Assessment: post-procedure vital signs reviewed and stable Respiratory status: spontaneous breathing, nonlabored ventilation, respiratory function stable and patient connected to nasal cannula oxygen Cardiovascular status: blood pressure returned to baseline and stable Postop Assessment: no apparent nausea or vomiting Anesthetic complications: no   No notable events documented.  Last Vitals:  Vitals:   02/25/24 1545 02/25/24 1600  BP: 101/67 104/65  Pulse: (!) 57 (!) 56  Resp: 18 18  Temp:    SpO2: 96% 95%    Last Pain:  Vitals:   02/25/24 1600  TempSrc:   PainSc: Asleep                 Lynwood MARLA Cornea

## 2024-02-25 NOTE — Transfer of Care (Signed)
 Immediate Anesthesia Transfer of Care Note  Patient: Tyler Bowman  Procedure(s) Performed: REMOVAL OF DEEP LEFT MANDIBULAR HARDWARE (Left: Face)  Patient Location: PACU  Anesthesia Type:General  Level of Consciousness: awake, alert , and oriented  Airway & Oxygen Therapy: Patient Spontanous Breathing and Patient connected to face mask oxygen  Post-op Assessment: Report given to RN and Post -op Vital signs reviewed and stable  Post vital signs: Reviewed and stable  Last Vitals:  Vitals Value Taken Time  BP 116/70 02/25/24 15:07  Temp 36.6 C 02/25/24 15:07  Pulse 61 02/25/24 15:11  Resp 19 02/25/24 15:11  SpO2 93 % 02/25/24 15:11  Vitals shown include unfiled device data.  Last Pain:  Vitals:   02/25/24 1507  TempSrc:   PainSc: 0-No pain         Complications: No notable events documented.

## 2024-02-25 NOTE — Anesthesia Preprocedure Evaluation (Addendum)
 Anesthesia Evaluation  Patient identified by MRN, date of birth, ID band Patient awake    Reviewed: Allergy & Precautions, NPO status , Patient's Chart, lab work & pertinent test results  Airway Mallampati: II  TM Distance: >3 FB Neck ROM: Full    Dental  (+) Missing, Edentulous Upper   Pulmonary Current Smoker and Patient abstained from smoking.   Pulmonary exam normal        Cardiovascular + DVT  Normal cardiovascular exam     Neuro/Psych Seizures -, Well Controlled,     GI/Hepatic negative GI ROS,,,(+)     substance abuse    Endo/Other  negative endocrine ROS    Renal/GU negative Renal ROS     Musculoskeletal  (+)  narcotic dependent  Abdominal   Peds  Hematology  (+) Blood dyscrasia (Eliquis)   Anesthesia Other Findings Hardware complicating wound infection, subsequent encounter; Open fracture of left side of mandibular body, sequela  Reproductive/Obstetrics                              Anesthesia Physical Anesthesia Plan  ASA: 3  Anesthesia Plan: General   Post-op Pain Management:    Induction: Intravenous  PONV Risk Score and Plan: 1 and Ondansetron , Dexamethasone, Midazolam and Treatment may vary due to age or medical condition  Airway Management Planned: Nasal ETT  Additional Equipment:   Intra-op Plan:   Post-operative Plan: Extubation in OR  Informed Consent: I have reviewed the patients History and Physical, chart, labs and discussed the procedure including the risks, benefits and alternatives for the proposed anesthesia with the patient or authorized representative who has indicated his/her understanding and acceptance.     Dental advisory given  Plan Discussed with: CRNA  Anesthesia Plan Comments:         Anesthesia Quick Evaluation

## 2024-02-25 NOTE — Anesthesia Procedure Notes (Addendum)
 Procedure Name: Intubation Date/Time: 02/25/2024 1:07 PM  Performed by: Jolynn Mage, CRNAPre-anesthesia Checklist: Patient identified, Emergency Drugs available, Suction available and Patient being monitored Patient Re-evaluated:Patient Re-evaluated prior to induction Oxygen Delivery Method: Circle system utilized Preoxygenation: Pre-oxygenation with 100% oxygen Induction Type: IV induction Ventilation: Mask ventilation without difficulty Laryngoscope Size: Mac, Glidescope and 4 Nasal Tubes: Nasal prep performed, Nasal Rae and Left Tube size: 7.5 mm Airway Equipment and Method: Video-laryngoscopy Placement Confirmation: ETT inserted through vocal cords under direct vision, positive ETCO2 and breath sounds checked- equal and bilateral Tube secured with: Tape Dental Injury: Teeth and Oropharynx as per pre-operative assessment  Difficulty Due To: Difficult Airway- due to dentition
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
## 2024-02-26 ENCOUNTER — Encounter (HOSPITAL_COMMUNITY): Payer: Self-pay | Admitting: Otolaryngology

## 2024-02-26 DIAGNOSIS — T8460XD Infection and inflammatory reaction due to internal fixation device of unspecified site, subsequent encounter: Secondary | ICD-10-CM | POA: Diagnosis not present

## 2024-02-26 LAB — HIV ANTIBODY (ROUTINE TESTING W REFLEX): HIV Screen 4th Generation wRfx: NONREACTIVE

## 2024-02-26 MED ORDER — SUZETRIGINE 50 MG PO TABS: 50.0000 mg | ORAL_TABLET | Freq: Two times a day (BID) | ORAL | 0 refills | Status: AC

## 2024-02-26 MED ORDER — AMOXICILLIN-POT CLAVULANATE 875-125 MG PO TABS: 1.0000 | ORAL_TABLET | Freq: Two times a day (BID) | ORAL | 0 refills | Status: AC

## 2024-02-26 MED ORDER — OXYCODONE HCL 5 MG PO TABS: 5.0000 mg | ORAL_TABLET | Freq: Four times a day (QID) | ORAL | 0 refills | Status: AC | PRN

## 2024-02-26 NOTE — Discharge Instructions (Signed)
 Post-operative Patient Instructions Tyler Bowman. Tyler Lalli MD  Surgery What to expect: A dressing will be placed on your surgical sites. You can change the gauze daily as needed for red discoloration.   Recovery/Restrictions: -No strenuous activity for at least the first week after your procedure -Bruising and swelling are expected and will take weeks to go away (consider using ice packs) -Please contact our office immediately if you experience any signs/symptoms of infection (redness, pain, or fever of 100.64F or greater)  Wound Wound care: The goal is to keep your wounds clean and moist to prevent scabs or crusts.   1. Clean wound wounds with soap and water using a cue tip if any crusts are present  2. Next, apply a thin layer of Aquaphor ointment  Diet: Soft diet OK. Advance as tolerated to regular food.   Care Healing Period: For the best healing, please protect the area from the sun   You may be asked to begin massaging the scars several weeks after surgery. Scars can be massaged in horizontal, vertical, and circular motions.   Adult Post-Operative Pain Management  Pain medication is given immediately following your surgery to help with post-operative pain. Do not wake up or set an alarm to wake up and take pain medications. Sleep and rest.  Upon your discharge home, we suggest scheduled doses of Acetaminophen  (Tylenol ) every 6 hours and Ibuprofen  (Motrin ) every 6 hours, alternating between medications every 3 hours (i.e. Take Tylenol  and wait 3 hours, then take Motrin  and wait 3 hours, repeat) for the first 3-4 days after surgery. If you are without significant pain, medications can be taken more infrequently. It is important to follow dosing instructions on the medication bottle or prescription.   Sample of medication dosing schedule  Give dose of: Time: Given:  Acetaminophen  12 a.m.   Ibuprofen  3 a.m.   Acetaminophen  6 a.m.   Ibuprofen  9 a.m.   Acetaminophen  12 p.m.    Ibuprofen  3 p.m.   Acetaminophen  6 p.m.   Ibuprofen  9 p.m.    If you need to call after clinic hours for a concern, call [phone] and ask for the "physician on call for ENT."  [address] [place], [place] [address] Phone: [phone]

## 2024-02-26 NOTE — Progress Notes (Signed)
 Pt refused assessment from Care RN this AM. Requested pain meds, this RN brought PRN oxy and pt became upset.  Pt wanted PRN IV Dilaudid  to be administered first. I informed him it is our policy to provide oral medications prior to IV medications especially if plan is to be discharged.  Pt took the pill but called this RN stupid little b* who doesn't want to do her job. This RN respectfully stepped away and informed Consulting Civil Engineer.

## 2024-02-26 NOTE — Discharge Summary (Signed)
 Physician Discharge Summary  Patient ID: Tyler Bowman MRN: [id number] DOB/AGE: 10/12/1975 48 y.o.  Admit date: 02/25/2024 Discharge date: 02/26/2024  Admission Diagnoses:  Principal Problem:   Hardware complicating wound infection, sequela   Discharge Diagnoses:  Same  Surgeries: Procedure(s): REMOVAL OF DEEP LEFT MANDIBULAR HARDWARE on 02/25/2024  Discharged Condition: Improved  Hospital Course: Tyler Bowman is an 48 y.o. male who was admitted 02/25/2024 with a chief complaint of No chief complaint on file. , and found to have a diagnosis of Hardware complicating wound infection, sequela.  They were brought to the operating room on 02/25/2024 and underwent the above named procedures.    Physical Exam:  General: Awake and alert, no acute distress Neck: Penrose removed. Neck soft/flat. Nylon sutures inact. Gauze replaced.  Recent vital signs:  Vitals:   02/26/24 0017 02/26/24 0536  BP: 105/68 115/76  Pulse: (!) 59 63  Resp:    Temp: 97.6 F (36.4 C) 98.1 F (36.7 C)  SpO2: 97% 96%    Recent laboratory studies:  Results for orders placed or performed during the hospital encounter of 02/25/24  Aerobic/Anaerobic Culture w Gram Stain (surgical/deep wound)   Collection Time: 02/25/24  2:09 PM   Specimen: Path fluid; Body Fluid  Result Value Ref Range   Specimen Description WOUND    Special Requests LEFT MANDIBLE PUS PT ON ANCEF    Gram Stain      FEW WBC PRESENT, PREDOMINANTLY PMN NO ORGANISMS SEEN    Culture      NO GROWTH < 24 HOURS Performed at [facility], [address] [place], [place] [address]    Report Status PENDING   HIV Antibody (routine testing w rflx)   Collection Time: 02/26/24  6:29 AM  Result Value Ref Range   HIV Screen 4th Generation wRfx Non Reactive Non Reactive    Discharge Medications:   Allergies as of 02/26/2024       Reactions   Aleve [naproxen] Anaphylaxis, Swelling, Other (See Comments)   Edema Syncope    Hydrocodone Itching, Swelling   Eye swelling   Remeron [mirtazapine] Other (See Comments)   Nightmares         Medication List     PAUSE taking these medications    Eliquis 5 MG Tabs tablet Wait to take this until: February 28, 2024 Generic drug: apixaban Take 5 mg by mouth 2 (two) times daily.       STOP taking these medications    chlorhexidine 0.12 % solution Commonly known as: PERIDEX       TAKE these medications    acetaminophen  500 MG tablet Commonly known as: TYLENOL  Take 1,000 mg by mouth every 6 (six) hours as needed for moderate pain (pain score 4-6).   ALPRAZolam 1 MG tablet Commonly known as: XANAX Take 1 mg by mouth 2 (two) times daily.   amoxicillin -clavulanate 875-125 MG tablet Commonly known as: AUGMENTIN Take 1 tablet by mouth 2 (two) times daily for 7 days.   BC HEADACHE PO Take by mouth.   furosemide 40 MG tablet Commonly known as: LASIX Take 40 mg by mouth daily as needed for fluid or edema.   gabapentin 300 MG capsule Commonly known as: NEURONTIN Take 300 mg by mouth 2 (two) times daily.   ibuprofen  600 MG tablet Commonly known as: ADVIL  Take 600-1,200 mg by mouth every 6 (six) hours as needed for moderate pain (pain score 4-6).   methocarbamol 500 MG tablet Commonly known as: ROBAXIN  Take 500 mg by mouth 2 (two) times daily as needed for muscle spasms.   Oxycodone  HCl 10 MG Tabs Take 10 mg by mouth in the morning, at noon, and at bedtime. What changed: Another medication with the same name was changed. Make sure you understand how and when to take each.   oxyCODONE  5 MG immediate release tablet Commonly known as: Roxicodone  Take 1 tablet (5 mg total) by mouth every 6 (six) hours as needed for up to 5 days for severe pain (pain score 7-10) or breakthrough pain. What changed: reasons to take this   polyethylene glycol 17 g packet Commonly known as: MIRALAX / GLYCOLAX Take 17 g by mouth daily as needed for moderate  constipation.   Suzetrigine 50 MG Tabs Take 50 mg by mouth in the morning and at bedtime for 7 days.   topiramate 50 MG tablet Commonly known as: TOPAMAX Take 50 mg by mouth 2 (two) times daily as needed (migraine).        Diagnostic Studies: No results found.  Disposition: Discharge disposition: 01-Home or Self Care       Discharge Instructions     Discharge patient   Complete by: As directed    After seen by Dr. Luciano on morning rounds   Discharge disposition: 01-Home or Self Care   Discharge patient date: 02/26/2024          Signed: Elspeth Luciano 02/26/2024, 11:01 AM

## 2024-03-01 LAB — AEROBIC/ANAEROBIC CULTURE W GRAM STAIN (SURGICAL/DEEP WOUND): Culture: NO GROWTH

## 2024-03-09 ENCOUNTER — Encounter (HOSPITAL_COMMUNITY): Payer: Self-pay | Admitting: Otolaryngology

## 2024-03-09 NOTE — Addendum Note (Signed)
 Addendum  created 03/09/24 2023 by Keneth Lynwood POUR, MD   Intraprocedure Event edited, Intraprocedure Staff edited

## 2024-03-16 ENCOUNTER — Inpatient Hospital Stay (HOSPITAL_COMMUNITY)
Admission: EM | Admit: 2024-03-16 | Discharge: 2024-03-20 | DRG: 863 | Disposition: A | Discharge status: Home Health | Source: Ambulatory Visit | Attending: Internal Medicine | Admitting: Internal Medicine

## 2024-03-16 ENCOUNTER — Other Ambulatory Visit: Payer: Self-pay

## 2024-03-16 ENCOUNTER — Emergency Department (HOSPITAL_COMMUNITY)

## 2024-03-16 DIAGNOSIS — F411 Generalized anxiety disorder: Secondary | ICD-10-CM | POA: Diagnosis present

## 2024-03-16 DIAGNOSIS — F17210 Nicotine dependence, cigarettes, uncomplicated: Secondary | ICD-10-CM | POA: Diagnosis present

## 2024-03-16 DIAGNOSIS — K122 Cellulitis and abscess of mouth: Secondary | ICD-10-CM | POA: Diagnosis not present

## 2024-03-16 DIAGNOSIS — Z888 Allergy status to other drugs, medicaments and biological substances status: Secondary | ICD-10-CM

## 2024-03-16 DIAGNOSIS — T8141XA Infection following a procedure, superficial incisional surgical site, initial encounter: Principal | ICD-10-CM | POA: Diagnosis present

## 2024-03-16 DIAGNOSIS — Z885 Allergy status to narcotic agent status: Secondary | ICD-10-CM

## 2024-03-16 DIAGNOSIS — Z86718 Personal history of other venous thrombosis and embolism: Secondary | ICD-10-CM

## 2024-03-16 DIAGNOSIS — L03211 Cellulitis of face: Secondary | ICD-10-CM | POA: Diagnosis not present

## 2024-03-16 DIAGNOSIS — Z87892 Personal history of anaphylaxis: Secondary | ICD-10-CM

## 2024-03-16 DIAGNOSIS — M869 Osteomyelitis, unspecified: Secondary | ICD-10-CM

## 2024-03-16 DIAGNOSIS — Z7901 Long term (current) use of anticoagulants: Secondary | ICD-10-CM

## 2024-03-16 DIAGNOSIS — M272 Inflammatory conditions of jaws: Secondary | ICD-10-CM | POA: Diagnosis present

## 2024-03-16 DIAGNOSIS — Y838 Other surgical procedures as the cause of abnormal reaction of the patient, or of later complication, without mention of misadventure at the time of the procedure: Secondary | ICD-10-CM | POA: Diagnosis present

## 2024-03-16 LAB — CBC WITH DIFFERENTIAL/PLATELET
Abs Immature Granulocytes: 0.06 K/uL (ref 0.00–0.07)
Basophils Absolute: 0.1 K/uL (ref 0.0–0.1)
Basophils Relative: 0 %
Eosinophils Absolute: 0.3 K/uL (ref 0.0–0.5)
Eosinophils Relative: 2 %
HCT: 42.7 % (ref 39.0–52.0)
Hemoglobin: 14.3 g/dL (ref 13.0–17.0)
Immature Granulocytes: 0 %
Lymphocytes Relative: 31 %
Lymphs Abs: 4.3 K/uL — ABNORMAL HIGH (ref 0.7–4.0)
MCH: 31.7 pg (ref 26.0–34.0)
MCHC: 33.5 g/dL (ref 30.0–36.0)
MCV: 94.7 fL (ref 80.0–100.0)
Monocytes Absolute: 1.3 K/uL — ABNORMAL HIGH (ref 0.1–1.0)
Monocytes Relative: 9 %
Neutro Abs: 7.9 K/uL — ABNORMAL HIGH (ref 1.7–7.7)
Neutrophils Relative %: 58 %
Platelets: 352 K/uL (ref 150–400)
RBC: 4.51 MIL/uL (ref 4.22–5.81)
RDW: 14.2 % (ref 11.5–15.5)
WBC: 13.8 K/uL — ABNORMAL HIGH (ref 4.0–10.5)
nRBC: 0 % (ref 0.0–0.2)

## 2024-03-16 LAB — COMPREHENSIVE METABOLIC PANEL WITH GFR
ALT: 16 U/L (ref 0–44)
AST: 18 U/L (ref 15–41)
Albumin: 3.7 g/dL (ref 3.5–5.0)
Alkaline Phosphatase: 79 U/L (ref 38–126)
Anion gap: 14 (ref 5–15)
BUN: 14 mg/dL (ref 6–20)
CO2: 25 mmol/L (ref 22–32)
Calcium: 8.8 mg/dL — ABNORMAL LOW (ref 8.9–10.3)
Chloride: 100 mmol/L (ref 98–111)
Creatinine, Ser: 1 mg/dL (ref 0.61–1.24)
GFR, Estimated: >60 mL/min (ref >60)
Glucose, Bld: 97 mg/dL (ref 70–99)
Potassium: 3.7 mmol/L (ref 3.5–5.1)
Sodium: 139 mmol/L (ref 135–145)
Total Bilirubin: 0.4 mg/dL (ref 0.0–1.2)
Total Protein: 6 g/dL — ABNORMAL LOW (ref 6.5–8.1)

## 2024-03-16 LAB — I-STAT CG4 LACTIC ACID, ED: Lactic Acid, Venous: 1.1 mmol/L (ref 0.5–1.9)

## 2024-03-16 MED ORDER — ONDANSETRON 4 MG PO TBDP
4.0000 mg | ORAL_TABLET | Freq: Once | ORAL | Status: AC
  Administered 2024-03-16: 4 mg via ORAL
  Filled 2024-03-16: qty 1

## 2024-03-16 MED ORDER — ACETAMINOPHEN 650 MG RE SUPP: 650.0000 mg | Freq: Four times a day (QID) | RECTAL | Status: DC | PRN

## 2024-03-16 MED ORDER — PREDNISONE 10 MG PO TABS
20.0000 mg | ORAL_TABLET | Freq: Every day | ORAL | Status: DC
  Administered 2024-03-17 – 2024-03-20 (×4): 20 mg via ORAL
  Filled 2024-03-16: qty 1
  Filled 2024-03-16 (×3): qty 2

## 2024-03-16 MED ORDER — HYDROMORPHONE HCL 1 MG/ML IJ SOLN
0.5000 mg | Freq: Four times a day (QID) | INTRAMUSCULAR | Status: DC | PRN
  Administered 2024-03-16 – 2024-03-20 (×12): 0.5 mg via INTRAVENOUS
  Filled 2024-03-16 (×3): qty 0.5
  Filled 2024-03-16: qty 1
  Filled 2024-03-16: qty 0.5
  Filled 2024-03-16: qty 1
  Filled 2024-03-16 (×5): qty 0.5
  Filled 2024-03-16: qty 1
  Filled 2024-03-16: qty 0.5

## 2024-03-16 MED ORDER — TOPIRAMATE 25 MG PO TABS: 50.0000 mg | ORAL_TABLET | Freq: Two times a day (BID) | ORAL | Status: DC | PRN

## 2024-03-16 MED ORDER — SODIUM CHLORIDE 0.9 % IV SOLN
3.0000 g | Freq: Four times a day (QID) | INTRAVENOUS | Status: AC
  Administered 2024-03-16 – 2024-03-17 (×4): 3 g via INTRAVENOUS
  Filled 2024-03-16 (×4): qty 8

## 2024-03-16 MED ORDER — ALPRAZOLAM 0.5 MG PO TABS
1.0000 mg | ORAL_TABLET | Freq: Two times a day (BID) | ORAL | Status: DC
  Administered 2024-03-16 – 2024-03-20 (×8): 1 mg via ORAL
  Filled 2024-03-16 (×4): qty 2
  Filled 2024-03-16: qty 4
  Filled 2024-03-16 (×2): qty 2
  Filled 2024-03-16: qty 4

## 2024-03-16 MED ORDER — IOHEXOL 350 MG/ML SOLN
75.0000 mL | Freq: Once | INTRAVENOUS | Status: AC | PRN
  Administered 2024-03-16: 75 mL via INTRAVENOUS

## 2024-03-16 MED ORDER — SODIUM CHLORIDE 0.9 % IV SOLN: INTRAVENOUS | Status: AC

## 2024-03-16 MED ORDER — ACETAMINOPHEN 325 MG PO TABS
650.0000 mg | ORAL_TABLET | Freq: Four times a day (QID) | ORAL | Status: DC | PRN
  Administered 2024-03-17 – 2024-03-20 (×3): 650 mg via ORAL
  Filled 2024-03-16 (×3): qty 2

## 2024-03-16 MED ORDER — GABAPENTIN 300 MG PO CAPS
300.0000 mg | ORAL_CAPSULE | Freq: Two times a day (BID) | ORAL | Status: DC
  Administered 2024-03-16 – 2024-03-20 (×8): 300 mg via ORAL
  Filled 2024-03-16 (×8): qty 1

## 2024-03-16 MED ORDER — DULOXETINE HCL 30 MG PO CPEP
30.0000 mg | ORAL_CAPSULE | Freq: Every day | ORAL | Status: DC
  Administered 2024-03-17 – 2024-03-20 (×4): 30 mg via ORAL
  Filled 2024-03-16 (×4): qty 1

## 2024-03-16 MED ORDER — BISACODYL 5 MG PO TBEC
5.0000 mg | DELAYED_RELEASE_TABLET | Freq: Every day | ORAL | Status: DC | PRN
Start: 2024-03-16 — End: 2024-03-20

## 2024-03-16 MED ORDER — ONDANSETRON HCL 4 MG PO TABS: 4.0000 mg | ORAL_TABLET | Freq: Four times a day (QID) | ORAL | Status: DC | PRN

## 2024-03-16 MED ORDER — SENNOSIDES-DOCUSATE SODIUM 8.6-50 MG PO TABS: 1.0000 | ORAL_TABLET | Freq: Every evening | ORAL | Status: DC | PRN

## 2024-03-16 MED ORDER — ACETAMINOPHEN 500 MG PO TABS
500.0000 mg | ORAL_TABLET | Freq: Once | ORAL | Status: AC
  Administered 2024-03-16: 500 mg via ORAL
  Filled 2024-03-16: qty 1

## 2024-03-16 MED ORDER — OXYCODONE-ACETAMINOPHEN 5-325 MG PO TABS
1.0000 | ORAL_TABLET | Freq: Once | ORAL | Status: AC
  Administered 2024-03-16: 1 via ORAL
  Filled 2024-03-16: qty 1

## 2024-03-16 MED ORDER — ONDANSETRON HCL 4 MG/2ML IJ SOLN: 4.0000 mg | Freq: Four times a day (QID) | INTRAMUSCULAR | Status: DC | PRN

## 2024-03-16 MED ORDER — METHOCARBAMOL 500 MG PO TABS
500.0000 mg | ORAL_TABLET | Freq: Two times a day (BID) | ORAL | Status: DC | PRN
Start: 2024-03-16 — End: 2024-03-20
  Administered 2024-03-19: 500 mg via ORAL
  Filled 2024-03-16: qty 1

## 2024-03-16 MED ORDER — OXYCODONE-ACETAMINOPHEN 5-325 MG PO TABS
1.0000 | ORAL_TABLET | Freq: Four times a day (QID) | ORAL | Status: DC | PRN
  Administered 2024-03-17 – 2024-03-20 (×9): 1 via ORAL
  Filled 2024-03-16 (×9): qty 1

## 2024-03-16 NOTE — ED Provider Notes (Signed)
 [place] [facility]PARTMENT AT [facility] Provider Note   CSN: [id number] Arrival date & time: 03/16/24  9053     Patient presents with: Abscess and Jaw Pain   Tyler Bowman is a 48 y.o. male status post open approach to removal of infected mandibular hardware on 02/25/24 (Prior ORIF bilateral mandibular fractures with [facility] Shadow 09/26/24, removal of hardware 10/28/23), presents with concern for ongoing pain in his jaw.  Reports difficulty with opening his mouth fully.  Denies any difficulties with swallowing.  Denies any fever or chills at home.  Reports he has been taking the course of clindamycin as prescribed without significant improvement in his symptoms.  Patient saw ENT Dr. Luciano yesterday who recommended admission for IV antibiotics and potential surgical washout tomorrow    Abscess Associated symptoms: no fever        Prior to Admission medications   Medication Sig Start Date End Date Taking? Authorizing Provider  clindamycin (CLEOCIN) 300 MG capsule Take 300 mg by mouth 3 (three) times daily. 03/10/24 03/20/24 Yes [provider]  DULoxetine  (CYMBALTA ) 30 MG capsule Take 30 mg by mouth daily. 03/12/24  Yes [provider]  oxyCODONE -acetaminophen  (PERCOCET/ROXICET) 5-325 MG tablet Take 1 tablet by mouth every 6 (six) hours as needed (Breakthrough pain). 03/15/24 03/20/24 Yes [provider]  predniSONE  (DELTASONE ) 20 MG tablet Take 20 mg by mouth as directed. 03/10/24  Yes [provider]  acetaminophen  (TYLENOL ) 500 MG tablet Take 1,000 mg by mouth every 6 (six) hours as needed for moderate pain (pain score 4-6).    [provider]  ALPRAZolam  (XANAX ) 1 MG tablet Take 1 mg by mouth 2 (two) times daily.    [provider]  apixaban  (ELIQUIS ) 5 MG TABS tablet Take 5 mg by mouth 2 (two) times daily.    [provider]  Aspirin-Salicylamide-Caffeine (BC HEADACHE PO) Take by  mouth.    [provider]  furosemide (LASIX) 40 MG tablet Take 40 mg by mouth daily as needed for fluid or edema.    [provider]  gabapentin  (NEURONTIN ) 300 MG capsule Take 300 mg by mouth 2 (two) times daily.    [provider]  ibuprofen  (ADVIL ) 600 MG tablet Take 600-1,200 mg by mouth every 6 (six) hours as needed for moderate pain (pain score 4-6). 12/29/23   [provider]  methocarbamol  (ROBAXIN ) 500 MG tablet Take 500 mg by mouth 2 (two) times daily as needed for muscle spasms.    [provider]  Oxycodone  HCl 10 MG TABS Take 10 mg by mouth in the morning, at noon, and at bedtime. 01/31/24   [provider]  polyethylene glycol (MIRALAX  / GLYCOLAX ) 17 g packet Take 17 g by mouth daily as needed for moderate constipation.    [provider]  topiramate  (TOPAMAX ) 50 MG tablet Take 50 mg by mouth 2 (two) times daily as needed (migraine).    [provider]    Allergies: Aleve [naproxen], Hydrocodone, and Remeron [mirtazapine]    Review of Systems  Constitutional:  Negative for fever.    Updated Vital Signs BP 121/61 (BP Location: Right Arm)   Pulse 64   Temp 97.9 F (36.6 C)   Resp 18   SpO2 99%   Physical Exam Vitals and nursing note reviewed.  Constitutional:      General: He is not in acute distress.    Appearance: He is well-developed.  HENT:  Head: Normocephalic and atraumatic.     Mouth/Throat:     Comments: Patient able to open jaw about 3 finger widths.  Swallowing without difficulty.  No edema of the lips, tongue, or posterior oropharynx. No peritonsillar abscess. He is diffusely tender along the right and left mandible and submandibular space.  No overlying erythema noted. Eyes:     Conjunctiva/sclera: Conjunctivae normal.  Neck:     Comments: Neck is soft and supple.  No nuchal rigidity Cardiovascular:     Rate and Rhythm: Normal rate and regular rhythm.     Heart sounds: No murmur  heard. Pulmonary:     Effort: Pulmonary effort is normal. No respiratory distress.     Breath sounds: Normal breath sounds.  Abdominal:     Palpations: Abdomen is soft.     Tenderness: There is no abdominal tenderness.  Musculoskeletal:        General: No swelling.     Cervical back: Normal range of motion and neck supple. No rigidity.  Skin:    General: Skin is warm and dry.     Capillary Refill: Capillary refill takes less than 2 seconds.  Neurological:     Mental Status: He is alert.  Psychiatric:        Mood and Affect: Mood normal.     (all labs ordered are listed, but only abnormal results are displayed) Labs Reviewed  COMPREHENSIVE METABOLIC PANEL WITH GFR - Abnormal; Notable for the following components:      Result Value   Calcium 8.8 (*)    Total Protein 6.0 (*)    All other components within normal limits  CBC WITH DIFFERENTIAL/PLATELET - Abnormal; Notable for the following components:   WBC 13.8 (*)    Neutro Abs 7.9 (*)    Lymphs Abs 4.3 (*)    Monocytes Absolute 1.3 (*)    All other components within normal limits  URINALYSIS, W/ REFLEX TO CULTURE (INFECTION SUSPECTED)  I-STAT CG4 LACTIC ACID, ED    EKG: None  Radiology: CT Soft Tissue Neck W Contrast Result Date: 03/16/2024 EXAM: CT NECK WITH CONTRAST 03/16/2024 01:55:21 PM TECHNIQUE: CT of the neck was performed with the administration of intravenous contrast. Multiplanar reformatted images are provided for review. Automated exposure control, iterative reconstruction, and/or weight based adjustment of the mA/kV was utilized to reduce the radiation dose to as low as reasonably achievable. 75 mL of iohexol  (OMNIPAQUE ) 350 MG/ML injection was administered. COMPARISON: CT maxillofacial 01/26/2024. CLINICAL HISTORY: Soft tissue infection suspected, neck, no prior imaging. FINDINGS: AERODIGESTIVE TRACT: No abnormality along the aerodigestive structures in the neck. No discrete mass. No edema. SALIVARY GLANDS: The  parotid and submandibular glands are unremarkable. THYROID: Unremarkable. LYMPH NODES: There are mildly prominent left level 1a and 1b lymph nodes which are likely reactive in the setting of postsurgical changes. Also possible residual component of cellulitis in the left submandibular space. No enlarged or suspicious appearing lymph nodes in the right side of the neck. SOFT TISSUES: Interval postsurgical changes in the left neck with surgical clips in the left submandibular space and submental space with adjacent soft tissue swelling likely reflecting postoperative changes. There is mild stranding in the subcutaneous fat over the left aspect of the mandible. Overall subcutaneous stranding in this region is decreased from the prior CT. Skin thickening in this region is also decreased. There is no focal fluid collection identified to suggest abscess formation. No soft tissue swelling involving the floor of mouth or sublingual space. BRAIN, ORBITS,  SINUSES AND MASTOIDS: Mucosal thickening and air fluid levels in the bilateral maxillary sinuses, right greater than left, which is significantly increased from prior. Findings are concerning for maxillary sinusitis. No acute abnormality. LUNGS AND MEDIASTINUM: No acute abnormality. BONES: Interval postsurgical changes of the left aspect of the mandible with removal of the plate and screw fixation hardware. Similar appearance of fracture through the left body of the mandible without evidence of osseous bridging across the fracture site. There are ghost screw tracts from the prior hardware. Chronic irregularity along the outer cortex of the left body of the mandible appears similar to prior. There is similar appearance of additional plate and screw hardware along the right angle of the mandible with similar appearance of fracture. There is no evidence of osseous bridging across the fracture site on the right. Additional subcondylar fracture of the right aspect of the mandible  with area of new bone formation similar to prior. IMPRESSION: 1. Interval postsurgical changes in the left neck as above with adjacent soft tissue swelling, likely reflecting postoperative changes. No focal fluid collection to suggest abscess formation. 2. Skin thickening and stranding over the left mandible and left submandibular space is improved. Possible residual cellulitis in the left submandibular space. 3. Mildly prominent left level 1a and 1b lymph nodes, likely reactive. 4. Mucosal thickening and air fluid levels in the bilateral maxillary sinuses, right greater than left, significantly increased from prior, concerning for maxillary sinusitis. Electronically signed by: Donnice Mania MD 03/16/2024 02:40 PM EST RP Workstation: [id number]     Procedures   Medications Ordered in the ED  Ampicillin-Sulbactam (UNASYN) 3 g in sodium chloride  0.9 % 100 mL IVPB (3 g Intravenous New Bag/Given 03/16/24 1852)  ondansetron  (ZOFRAN -ODT) disintegrating tablet 4 mg (4 mg Oral Given 03/16/24 1132)  iohexol  (OMNIPAQUE ) 350 MG/ML injection 75 mL (75 mLs Intravenous Contrast Given 03/16/24 1347)  oxyCODONE -acetaminophen  (PERCOCET/ROXICET) 5-325 MG per tablet 1 tablet (1 tablet Oral Given 03/16/24 1843)  acetaminophen  (TYLENOL ) tablet 500 mg (500 mg Oral Given 03/16/24 1843)    Clinical Course as of 03/16/24 1934  Thu Mar 16, 2024  1758  Dr. Luciano note: 'I will recommend hospitalist admission (due to multi-service involvement - plastics, ENT, Oral surgery, ID), ID Consultation, PICC line and prolonged IV abx 3-6 weeks due to recidivistic infection after hardware removal. Pending CT findings possible add-on Friday for washout of neck. [AF]  1811 Consulted with ENT Dr. Carlie who recommended speaking with Dr. Luciano and gave me his direct phone number.  Hospitalist admission for IV antibiotics, IV Unasyn in the meantime until ID can give reccomendations [AF]  1916 Consulted with hospitalist Dr Tura who  recommends admission [AF]    Clinical Course User Index [AF] Veta Palma, PA-C                                 Medical Decision Making Amount and/or Complexity of Data Reviewed Labs: ordered.  Risk Prescription drug management.     Differential diagnosis includes but is not limited to surgical site infection, cellulitis, dental abscess, sepsis  ED Course:  Upon initial evaluation, patient is very well-appearing, no acute distress.  Normal vital signs.  Reporting ongoing pain to his jaw for the past couple weeks.  On exam, he is diffusely tender over the left right mandible and submandibular space.  I do not appreciate any overlying erythema or obvious wounds.  He did recently have surgery  on the site on 02/25/2024.  Labs Ordered: I Ordered, and personally interpreted labs.  The pertinent results include:   CBC with leukocytosis of 13.8, this is improved from previous lab draws CMP unremarkable Lactic acid within normal limits  Imaging Studies ordered: I ordered imaging studies including CT soft tissue neck I independently visualized the imaging with scope of interpretation limited to determining acute life threatening conditions related to emergency care. Imaging showed  IMPRESSION:  1. Interval postsurgical changes in the left neck as above with adjacent soft  tissue swelling, likely reflecting postoperative changes. No focal fluid  collection to suggest abscess formation.  2. Skin thickening and stranding over the left mandible and left submandibular  space is improved. Possible residual cellulitis in the left submandibular  space.  3. Mildly prominent left level 1a and 1b lymph nodes, likely reactive.  4. Mucosal thickening and air fluid levels in the bilateral maxillary sinuses,  right greater than left, significantly increased from prior, concerning for  maxillary sinusitis.   I agree with the radiologist interpretation   Consultations Obtained: I requested  consultation with the ENT Dr. Luciano,  and discussed lab and imaging findings as well as pertinent plan - they recommend: Admission for IV antibiotics and infectious disease consult.  He recommends starting patient on IV Unasyn while waiting on ID recommendations.  He will see patient in the morning and decide on pursuing washout for tomorrow at that time.  Medications Given: Unasyn Percocet  Upon re-evaluation, patient remains well-appearing with stable vitals.  He does have a leukocytosis on labs, but no fever or tachycardia to suggest sepsis.  Pain well-controlled with a Percocet given.  He has been on clindamycin for the past week without improvement in symptoms, has failed outpatient therapy.  He was started on IV Unasyn per ENT recommendations.  Will be admitted to hospital service anticipation of surgical washout tomorrow with ENT and continuation of IV antibiotics.    Impression: Surgical site mandible infection  Disposition:  Admission with Dr. Lou    Record Review: External records from outside source obtained and reviewed including ENT note from yesterday with Dr. Luciano recommending admission     This chart was dictated using voice recognition software, Dragon. Despite the best efforts of this provider to proofread and correct errors, errors may still occur which can change documentation meaning.       Final diagnoses:  Facial cellulitis    ED Discharge Orders     None          Veta Palma, [place] 03/16/24 1934    Doretha Folks, MD 03/16/24 2354

## 2024-03-16 NOTE — Progress Notes (Signed)
????????????????????????    Patient:?  Tyler Bowman DOB:?  [birth year]  Sex:   male EMRN:?  [id number]  Encounter Date:  03/16/2024     ?  ?  Orders  ?  FACILITY:?? [facility] ?  SURGEON:?? Elspeth Coddington, MD ?  SURGERY DATE:? 03/17/24 Fri  ???????????TIME: ??????????????????????   ?  ANESTHESIA:??General  ?  STATUS:?Inpatient?  ?  PROCEDURE:?? I&D of Lt Mandible - 11044 ?  DIAGNOSIS:? Cellulitis of neck [L03.221]                        Open fracture of left side of mandible, sequela [S02.609S]    ASSISTANT: ?  ?  LATEX ALLERGY: ?  ?  EQUIPMENT: ????????????  ?  INSURANCE:? [place] [employer] # [id number]    SECONDARY:?????  ??  PRECERT#: ?

## 2024-03-16 NOTE — ED Notes (Signed)
 Pt. Stated, he feels very nauseated.
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
## 2024-03-16 NOTE — ED Notes (Signed)
 Unable to provide urine, sample cup in hand

## 2024-03-16 NOTE — Progress Notes (Addendum)
 ENT/FACIAL PLASTIC SURGERY CLINIC NOTE   History of Present Illness The patient is a 48 year old male who presents for follow-up on postoperative day 19 (02/25/24), status post open approach to removal of infected mandibular hardware (Prior ORIF bilateral mandibular fractures with [facility] Shadow 09/26/24, removal of hardware 10/28/23). He was seen last week by Dr. Vandegriend 03/10/24 for presumed facial cellulitis and was treated with clindamycin.  He reports a general improvement in his condition but continues to experience swelling, pain, and warmth in the affected area. He has not experienced any fevers or sensations of cracking or popping in his jaw. He also reports increased soreness in his jaw compared to previous weeks, which he believes is causing discomfort in his thyroid and ear. Additionally, he mentions a tingling sensation and coldness in his lip since his last visit on 03/10/2024. He expresses concern about potential bone damage from the recent procedure but does not believe there is a fracture. He recalls feeling well upon discharge from the hospital, despite some pain, but notes a change in the nature of the pain since then. He lives alone and is concerned about managing his pain at home. He also reports difficulty with certain words due to limited lip movement, which he attributes to his recent jaw injury. He has been adhering to his clindamycin regimen, which has resulted in some reduction in swelling, although not significant.  He has continued tobacco smoking throughout the entire course of injury and subsequent treatment.   PAST SURGICAL HISTORY: - Removal of infected mandibular hardware, 02/25/2024 - Pins in legs, age not specified  SOCIAL HISTORY He admits to smoking but not as much.  Physical Exam Pleasant adult male resting in NAD. Oral exam notable for poor remaining mandibular dentition anteriorly. Purulent drainage from left gingival incision site  from prior oral approach. Left neck incision well healed. Submental edema/swelling concerning for fluid collection/abscess. Left face with erythema tenderness c/w cellulitis. Left marginal mandibular weakness.   Results Component 2 wk ago  Specimen Description WOUND  Special Requests LEFT MANDIBLE PUS PT ON ANCEF   Gram Stain FEW WBC PRESENT, PREDOMINANTLY PMN NO ORGANISMS SEEN  Culture RARE STREPTOCOCCUS INTERMEDIUS RARE PEPTOSTREPTOCOCCUS MICROS Standardized susceptibility testing for this organism is not available. Performed at [facility], [address] [place], [place] [address]  Report Status 03/01/2024 FINAL  Organism ID, Bacteria STREPTOCOCCUS INTERMEDIUS  Resulting Agency [facility] <redacted file path>  Susceptibility  Organism Antibiotic Method Susceptibility  Streptococcus intermedius PENICILLIN MIC <=0.06 SENSITIVE: Sensitive  Streptococcus intermedius CEFTRIAXONE MIC <=0.12 SENSITIVE: Sensitive  Streptococcus intermedius ERYTHROMYCIN MIC <=0.12 SENSITIVE: Sensitive  Streptococcus intermedius LEVOFLOXACIN MIC 0.5 SENSITIVE: Sensitive  Streptococcus intermedius VANCOMYCIN MIC 0.25 SENSITIVE: Sensitive  Comment: RARE STREPTOCOCCUS INTERMEDIUS  Specimen Collected: 02/25/24 14:09    Assessment & Plan 1. Postoperative status following the removal of infected mandibular hardware. The patient continues to exhibit signs of infection, including swelling, pain, and warmth in the jaw area. There is a fluid collection present, and the jaw is more sore than before. Clindamycin has only slightly reduced the swelling. The persistent infection despite appropriate hardware removal may be due to underlying osteomyelitis, necessitating prolonged intravenous antibiotics. Smoking may also be contributing to the compromised blood supply and subsequent infection.   A CT scan of the neck with contrast will be ordered to assess the current state of the infection.  Intravenous antibiotics will be initiated, and the imaging results will be reviewed. Pending findings surgical intervention may be  recommended.   I have recommended the patient present to [facility] ed for workup and admission. Consider Dental/oral surgery evaluation for role to remove any decayed teeth adjacent to the infection.   I will recommend hospitalist admission (due to multi-service involvement - plastics, ENT, Oral surgery, ID), ID Consultation, PICC line and prolonged IV abx 3-6 weeks due to recidivistic infection after hardware removal. Pending CT findings possible add-on Friday for washout of neck.  A prescription for Percocet 10 tablets will be provided for pain management. The patient is advised to continue taking clindamycin until presenting to the ED tomorrow (I recommended today, however patient agreed to present to ED Thursday morning 03/16/24).    Electronically signed by:  Elspeth Coddington, MD  Staff Physician Facial Plastic & Reconstructive Surgery Otolaryngology - Head and Neck Surgery [facility] [place]
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
## 2024-03-17 ENCOUNTER — Encounter (HOSPITAL_COMMUNITY)
Admission: EM | Disposition: A | Discharge status: Home Health | Payer: Self-pay | Source: Ambulatory Visit | Attending: Internal Medicine

## 2024-03-17 ENCOUNTER — Inpatient Hospital Stay (HOSPITAL_COMMUNITY): Admission: RE | Admit: 2024-03-17 | Source: Home / Self Care | Admitting: Otolaryngology

## 2024-03-17 DIAGNOSIS — M8618 Other acute osteomyelitis, other site: Secondary | ICD-10-CM

## 2024-03-17 DIAGNOSIS — Z888 Allergy status to other drugs, medicaments and biological substances status: Secondary | ICD-10-CM | POA: Diagnosis not present

## 2024-03-17 DIAGNOSIS — Z7901 Long term (current) use of anticoagulants: Secondary | ICD-10-CM | POA: Diagnosis not present

## 2024-03-17 DIAGNOSIS — F411 Generalized anxiety disorder: Secondary | ICD-10-CM | POA: Diagnosis present

## 2024-03-17 DIAGNOSIS — L03211 Cellulitis of face: Secondary | ICD-10-CM | POA: Diagnosis present

## 2024-03-17 DIAGNOSIS — F17210 Nicotine dependence, cigarettes, uncomplicated: Secondary | ICD-10-CM | POA: Diagnosis present

## 2024-03-17 DIAGNOSIS — T847XXA Infection and inflammatory reaction due to other internal orthopedic prosthetic devices, implants and grafts, initial encounter: Secondary | ICD-10-CM

## 2024-03-17 DIAGNOSIS — K122 Cellulitis and abscess of mouth: Secondary | ICD-10-CM | POA: Diagnosis present

## 2024-03-17 DIAGNOSIS — T8141XA Infection following a procedure, superficial incisional surgical site, initial encounter: Secondary | ICD-10-CM | POA: Diagnosis present

## 2024-03-17 DIAGNOSIS — Z87892 Personal history of anaphylaxis: Secondary | ICD-10-CM | POA: Diagnosis not present

## 2024-03-17 DIAGNOSIS — M272 Inflammatory conditions of jaws: Secondary | ICD-10-CM | POA: Diagnosis present

## 2024-03-17 DIAGNOSIS — Z86718 Personal history of other venous thrombosis and embolism: Secondary | ICD-10-CM

## 2024-03-17 DIAGNOSIS — Z885 Allergy status to narcotic agent status: Secondary | ICD-10-CM | POA: Diagnosis not present

## 2024-03-17 DIAGNOSIS — Y838 Other surgical procedures as the cause of abnormal reaction of the patient, or of later complication, without mention of misadventure at the time of the procedure: Secondary | ICD-10-CM | POA: Diagnosis present

## 2024-03-17 LAB — CBC
HCT: 40.7 % (ref 39.0–52.0)
Hemoglobin: 13.4 g/dL (ref 13.0–17.0)
MCH: 31.9 pg (ref 26.0–34.0)
MCHC: 32.9 g/dL (ref 30.0–36.0)
MCV: 96.9 fL (ref 80.0–100.0)
Platelets: 290 K/uL (ref 150–400)
RBC: 4.2 MIL/uL — ABNORMAL LOW (ref 4.22–5.81)
RDW: 14.5 % (ref 11.5–15.5)
WBC: 9.4 K/uL (ref 4.0–10.5)
nRBC: 0 % (ref 0.0–0.2)

## 2024-03-17 LAB — URINALYSIS, W/ REFLEX TO CULTURE (INFECTION SUSPECTED)
Bacteria, UA: NONE SEEN
Bilirubin Urine: NEGATIVE
Glucose, UA: NEGATIVE mg/dL
Hgb urine dipstick: NEGATIVE
Ketones, ur: NEGATIVE mg/dL
Leukocytes,Ua: NEGATIVE
Nitrite: NEGATIVE
Protein, ur: NEGATIVE mg/dL
Specific Gravity, Urine: 1.015 (ref 1.005–1.030)
pH: 7 (ref 5.0–8.0)

## 2024-03-17 LAB — BASIC METABOLIC PANEL WITH GFR
Anion gap: 12 (ref 5–15)
BUN: 11 mg/dL (ref 6–20)
CO2: 25 mmol/L (ref 22–32)
Calcium: 8.3 mg/dL — ABNORMAL LOW (ref 8.9–10.3)
Chloride: 103 mmol/L (ref 98–111)
Creatinine, Ser: 0.81 mg/dL (ref 0.61–1.24)
GFR, Estimated: >60 mL/min (ref >60)
Glucose, Bld: 82 mg/dL (ref 70–99)
Potassium: 4.2 mmol/L (ref 3.5–5.1)
Sodium: 140 mmol/L (ref 135–145)

## 2024-03-17 SURGERY — IRRIGATION AND DEBRIDEMENT ABSCESS
Anesthesia: General | Laterality: Left

## 2024-03-17 MED ORDER — ENSURE PLUS HIGH PROTEIN PO LIQD
237.0000 mL | Freq: Two times a day (BID) | ORAL | Status: DC
  Administered 2024-03-17 – 2024-03-20 (×7): 237 mL via ORAL

## 2024-03-17 MED ORDER — APIXABAN 5 MG PO TABS
5.0000 mg | ORAL_TABLET | Freq: Two times a day (BID) | ORAL | Status: DC
  Administered 2024-03-17 – 2024-03-20 (×4): 5 mg via ORAL
  Filled 2024-03-17 (×6): qty 1

## 2024-03-17 MED ORDER — PIPERACILLIN-TAZOBACTAM 3.375 G IVPB
3.3750 g | Freq: Three times a day (TID) | INTRAVENOUS | Status: DC
  Administered 2024-03-17 – 2024-03-20 (×9): 3.375 g via INTRAVENOUS
  Filled 2024-03-17 (×8): qty 50

## 2024-03-17 MED ORDER — PROMETHAZINE HCL 25 MG PO TABS
25.0000 mg | ORAL_TABLET | Freq: Four times a day (QID) | ORAL | Status: DC | PRN
  Administered 2024-03-17: 25 mg via ORAL
  Filled 2024-03-17: qty 1

## 2024-03-17 NOTE — ED Notes (Signed)
 Floor notified patient coming up
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
## 2024-03-17 NOTE — TOC CM/SW Note (Signed)
 Transition of Care [facility]) - Inpatient Brief Assessment   Patient Details  Name: Tyler Bowman MRN: [id number] Date of Birth: 1975-10-23  [facility]) CM/SW Contact:    Lauraine FORBES Saa, LCSWA Phone Number: 03/17/2024, 12:17 PM   Clinical Narrative:  12:17 PM Per chart review, patient resides at home alone. Patient has a PCP and insurance. Patient does not have SNF/HH/DME history. Patient's preferred pharmacy's are [facility], [facility] [place], [facility], and [facility] [phone] [place]. No TOC needs identified at this time. TOC will continue to follow.  Transition of Care Asessment: Insurance and Status: Insurance coverage has been reviewed Patient has primary care physician: Yes Home environment has been reviewed: Private Residence Prior level of function:: N/A Prior/Current Home Services: No current home services Social Drivers of Health Review: SDOH reviewed no interventions necessary Readmission risk has been reviewed: Yes (Currently Green 10%) Transition of care needs: no transition of care needs at this time

## 2024-03-17 NOTE — Plan of Care (Signed)

## 2024-03-18 ENCOUNTER — Other Ambulatory Visit: Payer: Self-pay

## 2024-03-18 DIAGNOSIS — M8618 Other acute osteomyelitis, other site: Secondary | ICD-10-CM | POA: Diagnosis not present

## 2024-03-18 DIAGNOSIS — L03211 Cellulitis of face: Secondary | ICD-10-CM | POA: Diagnosis not present

## 2024-03-18 DIAGNOSIS — M869 Osteomyelitis, unspecified: Secondary | ICD-10-CM

## 2024-03-18 DIAGNOSIS — T847XXA Infection and inflammatory reaction due to other internal orthopedic prosthetic devices, implants and grafts, initial encounter: Secondary | ICD-10-CM | POA: Diagnosis not present

## 2024-03-18 LAB — RAPID URINE DRUG SCREEN, HOSP PERFORMED
Amphetamines: NOT DETECTED
Barbiturates: NOT DETECTED
Benzodiazepines: POSITIVE — AB
Cocaine: NOT DETECTED
Opiates: POSITIVE — AB
Tetrahydrocannabinol: POSITIVE — AB

## 2024-03-18 LAB — SEDIMENTATION RATE: Sed Rate: 4 mm/h (ref 0–16)

## 2024-03-18 LAB — C-REACTIVE PROTEIN: CRP: <0.5 mg/dL (ref <1.0)

## 2024-03-18 MED ORDER — CHLORHEXIDINE GLUCONATE CLOTH 2 % EX PADS
6.0000 | MEDICATED_PAD | Freq: Every day | CUTANEOUS | Status: DC
  Administered 2024-03-18 – 2024-03-20 (×2): 6 via TOPICAL

## 2024-03-18 MED ORDER — SODIUM CHLORIDE 0.9% FLUSH: 10.0000 mL | INTRAVENOUS | Status: DC | PRN
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
## 2024-03-18 NOTE — Progress Notes (Signed)
 Peripherally Inserted Central Catheter Placement  The IV Nurse has discussed with the patient and/or persons authorized to consent for the patient, the purpose of this procedure and the potential benefits and risks involved with this procedure.  The benefits include less needle sticks, lab draws from the catheter, and the patient may be discharged home with the catheter. Risks include, but not limited to, infection, bleeding, blood clot (thrombus formation), and puncture of an artery; nerve damage and irregular heartbeat and possibility to perform a PICC exchange if needed/ordered by physician.  Alternatives to this procedure were also discussed.  Bard Power PICC patient education guide, fact sheet on infection prevention and patient information card has been provided to patient /or left at bedside.    PICC Placement Documentation  PICC Single Lumen 03/18/24 Left Cephalic 42 cm 1 cm (Active)  Indication for Insertion or Continuance of Line Home intravenous therapies (PICC only) 03/18/24 1229  Exposed Catheter (cm) 1 cm 03/18/24 1229  Site Assessment Clean, Dry, Intact 03/18/24 1229  Line Status Flushed;Saline locked;Blood return noted 03/18/24 1229  Dressing Type Transparent;Securing device 03/18/24 1229  Dressing Status Antimicrobial disc/dressing in place;Clean, Dry, Intact 03/18/24 1229  Line Care Connections checked and tightened;Cap changed;Tubing changed 03/18/24 1229  Line Adjustment (NICU/IV Team Only) Yes 03/18/24 1229  Dressing Intervention New dressing;Adhesive placed at insertion site (IV team only);Adhesive placed around edges of dressing (IV team/ICU RN only) 03/18/24 1229  Dressing Change Due 03/25/24 03/18/24 1229       Tyler Bowman 03/18/2024, 12:30 PM

## 2024-03-18 NOTE — TOC Initial Note (Signed)
 Transition of Care [facility]) - Initial/Assessment Note    Patient Details  Name: Tyler Bowman MRN: [id number] Date of Birth: [birth year]  Transition of Care [facility]) CM/SW Contact:    Marval Gell, RN Phone Number: 03/18/2024, 9:41 AM  Clinical Narrative:                  Beatris w patient over the phone, discussed plan for home IV abx x 6 weeks w Zosyn .  He lives at home alone, but is agreeable to learn how to administer using extender on PICC line. Explained that he would have a HH RN that came around twice a week to assess site and do sterile dressing change.  New address is [address] [place] [address].  Provider is working on OPAT order, and Pam w [facility] has been notified for need of meds and HH RN.   Provider states expected DC date is Monday    Expected Discharge Plan: Home w Home Health Services Barriers to Discharge: Continued Medical Work up   Patient Goals and CMS Choice Patient states their goals for this hospitalization and ongoing recovery are:: to go home          Expected Discharge Plan and Services   Discharge Planning Services: CM Consult Post Acute Care Choice: Home Health Living arrangements for the past 2 months: Single Family Home                           HH Arranged: IV Antibiotics HH Agency: [facility] Date HH Agency Contacted: 03/18/24 Time HH Agency Contacted: [phone] Representative spoke with at [facility] Agency: pam  Prior Living Arrangements/Services Living arrangements for the past 2 months: Single Family Home Lives with:: Self                   Activities of Daily Living   ADL Screening (condition at time of admission) Independently performs ADLs?: Yes (appropriate for developmental age) Is the patient deaf or have difficulty hearing?: No Does the patient have difficulty seeing, even when wearing glasses/contacts?: No Does the patient have difficulty concentrating, remembering, or making decisions?: No  Permission  Sought/Granted                  Emotional Assessment              Admission diagnosis:  Facial cellulitis [L03.211] Patient Active Problem List   Diagnosis Date Noted   Cellulitis of submandibular region 03/17/2024   History of DVT (deep vein thrombosis) 03/17/2024   Generalized anxiety disorder 03/17/2024   Facial cellulitis 03/16/2024   Hardware complicating wound infection, sequela 02/25/2024   PCP:  Dorena Fernando HERO, MD Pharmacy:   [facility] [phone] - [place], [place] - [address] [place] [address] Phone: [phone] Fax: [phone]  [facility], [place] - [address] [place] [address] Phone: [phone] Fax: [phone]  [facility] [address] [place] [address] Phone: [phone] Fax: [phone]  [facility] - [place], [place] - [address] [place] [address] Phone: [phone] Fax: [phone]     Social Drivers of Health (SDOH) Social History: SDOH Screenings   Food Insecurity: No Food Insecurity (03/17/2024)  Housing: Low Risk  (03/17/2024)  Recent Concern: Housing - Medium Risk (03/15/2024)   Received from [facility]  Transportation Needs: No  Transportation Needs (03/17/2024)  Utilities: Not At Risk (03/17/2024)  Tobacco Use: High Risk (03/10/2024)   Received from [facility]   SDOH Interventions:     Readmission Risk Interventions     No data to display

## 2024-03-18 NOTE — Plan of Care (Signed)

## 2024-03-18 NOTE — Progress Notes (Signed)
 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   Culture Result: 10/31 strep intermedius  Patient doing well no concern  OPAT Orders Discharge antibiotics to be given via PICC line Discharge antibiotics: Piptazo 13.5 gram continuous daily infusion  After 04/27/24 will plan 2 more months of penicillin for possibility that this could be actino  Duration: 6 weeks  End Date: 04/27/24  [facility] Care Per Protocol:  Home health RN for IV administration and teaching; PICC line care and labs.    Labs weekly while on IV antibiotics: _x_ CBC with differential __ BMP _x_ CMP _x_ CRP _x_ ESR __ Vancomycin trough __ CK  _x_ Please pull PIC at completion of IV antibiotics __ Please leave PIC in place until doctor has seen patient or been notified  Fax weekly labs to [phone]  Clinic Follow Up Appt: 12/26 @ 1115  @  [facility] [address], [place], [place] [address] Phone: [phone]   ---------- Subjective Reviewed ent note No planned surgery Patient without worsening sx Tolerating piptazo   Objective: Vitals:   03/17/24 2049 03/18/24 0725  BP: 124/71 114/66  Pulse: 66 (!) 59  Resp: 18 18  Temp: 98.1 F (36.7 C) 98.4 F (36.9 C)  SpO2: 99% 97%   General/constitutional: no distress, pleasant HEENT: Normocephalic, PER, Conj Clear, EOMI, Oropharynx clear Neck -- slight induration submandibular area left side CV: rrr no mrg Lungs: clear to auscultation, normal respiratory effort Abd: Soft, Nontender Ext: no edema Skin: No Rash Neuro: nonfocal MSK: no peripheral joint swelling/tenderness/warmth; back spines nontender  Labs: Lab Results  Component Value Date   WBC 9.4 03/17/2024   HGB 13.4 03/17/2024   HCT 40.7 03/17/2024   MCV 96.9 03/17/2024   PLT 290 03/17/2024   Last metabolic panel Lab Results  Component Value Date   GLUCOSE 82 03/17/2024   NA 140 03/17/2024   K 4.2 03/17/2024   CL 103  03/17/2024   CO2 25 03/17/2024   BUN 11 03/17/2024   CREATININE 0.81 03/17/2024   GFRNONAA >60 03/17/2024   CALCIUM 8.3 (L) 03/17/2024   PROT 6.0 (L) 03/16/2024   ALBUMIN 3.7 03/16/2024   BILITOT 0.4 03/16/2024   ALKPHOS 79 03/16/2024   AST 18 03/16/2024   ALT 16 03/16/2024   ANIONGAP 12 03/17/2024   Imaging: reviewed

## 2024-03-18 NOTE — Progress Notes (Signed)
 Patient has permission from the doctor to go off the floor. Patient requested to go to [facility]. The patient is of the floor now with his girlfriend

## 2024-03-18 NOTE — Progress Notes (Signed)
 PHARMACY CONSULT NOTE FOR:  OUTPATIENT  PARENTERAL ANTIBIOTIC THERAPY (OPAT)  Indication: Osteomyelitis Regimen: Zosyn  13.5 gm IV daily - continuous infusion End date: 04/27/24  IV antibiotic discharge orders are pended. To discharging provider:  please sign these orders via discharge navigator,  Select New Orders & click on the button choice - Manage This Unsigned Work.     Thank you for allowing pharmacy to be a part of this patient's care.  Dorothyann DELENA Alert 03/18/2024, 12:09 PM

## 2024-03-19 DIAGNOSIS — L03211 Cellulitis of face: Secondary | ICD-10-CM | POA: Diagnosis not present

## 2024-03-19 NOTE — TOC Progression Note (Signed)
 Transition of Care [facility]) - Progression Note    Patient Details  Name: Tyler Bowman MRN: [id number] Date of Birth: 1975-08-30  [facility] CM/SW Contact  Marval Gell, RN Phone Number: 03/19/2024, 1:50 PM  Clinical Narrative:     Notified by Holley lelon Laundry that she has completed teaching for administration of home IV Abx. Anticipate DC tomorrow. RN CM- please reach out to [facility] to confirm delivery of meds on Monday, thank you   Expected Discharge Plan: Home w Home Health Services Barriers to Discharge: Continued Medical Work up               Expected Discharge Plan and Services   Discharge Planning Services: CM Consult Post Acute Care Choice: Home Health Living arrangements for the past 2 months: Single Family Home                           HH Arranged: IV Antibiotics HH Agency: [facility] Date HH Agency Contacted: 03/18/24 Time HH Agency Contacted: [phone] Representative spoke with at [facility] Agency: pam   Social Drivers of Health (SDOH) Interventions SDOH Screenings   Food Insecurity: No Food Insecurity (03/17/2024)  Housing: Low Risk  (03/17/2024)  Recent Concern: Housing - Medium Risk (03/15/2024)   Received from [facility]  Transportation Needs: No Transportation Needs (03/17/2024)  Utilities: Not At Risk (03/17/2024)  Tobacco Use: High Risk (03/10/2024)   Received from [facility]    Readmission Risk Interventions     No data to display

## 2024-03-19 NOTE — Plan of Care (Signed)
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
## 2024-03-19 NOTE — Progress Notes (Signed)
 Education was given to patient concerning not leaving the hospital, patient was observed going to his car with his girlfriend. The ER called to notify the floor. Patient has a pass from the doctor to 'leave the floor . It was intended to be to be able to eat at [employer]. Patient has been leaving the floor and not notifying staff that he is off the floor.

## 2024-03-19 NOTE — Progress Notes (Signed)
 Patient off the floor, new IV pole and pump ordered. IV pole is not sturdy. Education given to patient before leaving unit.

## 2024-03-20 DIAGNOSIS — L03211 Cellulitis of face: Secondary | ICD-10-CM | POA: Diagnosis not present

## 2024-03-20 MED ORDER — HEPARIN SOD (PORK) LOCK FLUSH 100 UNIT/ML IV SOLN
250.0000 [IU] | INTRAVENOUS | Status: AC | PRN
  Administered 2024-03-20: 250 [IU]

## 2024-03-20 MED ORDER — PIPERACILLIN-TAZOBACTAM IV (FOR PTA / DISCHARGE USE ONLY): 13.5000 g | INTRAVENOUS | 0 refills | Status: DC

## 2024-03-20 NOTE — TOC Transition Note (Signed)
 Transition of Care [facility]) - Discharge Note   Patient Details  Name: Tyler Bowman MRN: [id number] Date of Birth: 1975-07-17  [facility]) CM/SW Contact:  [name], RN Phone Number: 03/20/2024, 12:46 PM   Clinical Narrative:    Patient stable for discharge.  Pam with [facility] is aware of discharge and patient will be hooked up at 1600 today.    Final next level of care: Home w Home Health Services Barriers to Discharge: Barriers Resolved   Patient Goals and CMS Choice Patient states their goals for this hospitalization and ongoing recovery are:: to go home          Discharge Placement                 home      Discharge Plan and Services Additional resources added to the After Visit Summary for     Discharge Planning Services: CM Consult Post Acute Care Choice: Home Health                    HH Arranged: IV Antibiotics HH Agency: [facility] Date HH Agency Contacted: 03/18/24 Time HH Agency Contacted: [phone] Representative spoke with at [facility] Agency: pam  Social Drivers of Health (SDOH) Interventions SDOH Screenings   Food Insecurity: No Food Insecurity (03/17/2024)  Housing: Low Risk  (03/17/2024)  Recent Concern: Housing - Medium Risk (03/15/2024)   Received from [facility]  Transportation Needs: No Transportation Needs (03/17/2024)  Utilities: Not At Risk (03/17/2024)  Tobacco Use: High Risk (03/10/2024)   Received from [facility]     Readmission Risk Interventions     No data to display

## 2024-03-20 NOTE — Progress Notes (Signed)
 DISCHARGE NOTE HOME [place] HIBBERD to be discharged Home per MD order. Discussed prescriptions and follow up appointments with the patient. Prescriptions given to patient; medication list explained in detail. Patient verbalized understanding.  Skin clean, dry and intact without evidence of skin break down, no evidence of skin tears noted. IV catheter discontinued intact. Site without signs and symptoms of complications. Dressing and pressure applied. Pt denies pain at the site currently. No complaints noted.  Discharging with PICC line to continue ABX IV treatment Patient free of lines, drains, and wounds.   An After Visit Summary (AVS) was printed and given to the patient. Patient escorted via wheelchair, and discharged home via private auto.  Peyton SHAUNNA Pepper, RN

## 2024-03-20 NOTE — Plan of Care (Signed)

## 2024-03-20 NOTE — Plan of Care (Signed)
  Problem: Pain Managment: Goal: General experience of comfort will improve and/or be controlled Outcome: Progressing   Problem: Safety: Goal: Ability to remain free from injury will improve Outcome: Adequate for Discharge   Problem: Skin Integrity: Goal: Risk for impaired skin integrity will decrease Outcome: Adequate for Discharge
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
## 2024-03-27 ENCOUNTER — Other Ambulatory Visit: Payer: Self-pay

## 2024-03-27 ENCOUNTER — Emergency Department (HOSPITAL_COMMUNITY)

## 2024-03-27 ENCOUNTER — Encounter (HOSPITAL_COMMUNITY): Admission: EM | Disposition: A | Payer: Self-pay | Source: Home / Self Care

## 2024-03-27 ENCOUNTER — Inpatient Hospital Stay (HOSPITAL_COMMUNITY): Admitting: Anesthesiology

## 2024-03-27 ENCOUNTER — Inpatient Hospital Stay (HOSPITAL_COMMUNITY)

## 2024-03-27 ENCOUNTER — Inpatient Hospital Stay (HOSPITAL_COMMUNITY)
Admission: EM | Admit: 2024-03-27 | Discharge: 2024-03-30 | DRG: 958 | Disposition: A | Attending: General Surgery | Admitting: General Surgery

## 2024-03-27 DIAGNOSIS — S2500XA Unspecified injury of thoracic aorta, initial encounter: Secondary | ICD-10-CM

## 2024-03-27 DIAGNOSIS — F411 Generalized anxiety disorder: Secondary | ICD-10-CM | POA: Diagnosis not present

## 2024-03-27 DIAGNOSIS — S2501XA Minor laceration of thoracic aorta, initial encounter: Secondary | ICD-10-CM | POA: Diagnosis present

## 2024-03-27 DIAGNOSIS — I7103 Dissection of thoracoabdominal aorta: Secondary | ICD-10-CM

## 2024-03-27 DIAGNOSIS — M869 Osteomyelitis, unspecified: Principal | ICD-10-CM

## 2024-03-27 HISTORY — PX: COMPLEX WOUND CLOSURE: SHX6446

## 2024-03-27 HISTORY — PX: THORACIC AORTIC ENDOVASCULAR STENT GRAFT: SHX6112

## 2024-03-27 HISTORY — PX: ULTRASOUND GUIDANCE FOR VASCULAR ACCESS: SHX6516

## 2024-03-27 LAB — CBC
HCT: 42 % (ref 39.0–52.0)
Hemoglobin: 13.7 g/dL (ref 13.0–17.0)
MCH: 31.8 pg (ref 26.0–34.0)
MCHC: 32.6 g/dL (ref 30.0–36.0)
MCV: 97.4 fL (ref 80.0–100.0)
Platelets: 291 K/uL (ref 150–400)
RBC: 4.31 MIL/uL (ref 4.22–5.81)
RDW: 14.2 % (ref 11.5–15.5)
WBC: 21.9 K/uL — ABNORMAL HIGH (ref 4.0–10.5)
nRBC: 0 % (ref 0.0–0.2)

## 2024-03-27 LAB — COMPREHENSIVE METABOLIC PANEL WITH GFR
ALT: 698 U/L — ABNORMAL HIGH (ref 0–44)
AST: 740 U/L — ABNORMAL HIGH (ref 15–41)
Albumin: 3.2 g/dL — ABNORMAL LOW (ref 3.5–5.0)
Alkaline Phosphatase: 69 U/L (ref 38–126)
Anion gap: 9 (ref 5–15)
BUN: 22 mg/dL — ABNORMAL HIGH (ref 6–20)
CO2: 27 mmol/L (ref 22–32)
Calcium: 9.1 mg/dL (ref 8.9–10.3)
Chloride: 104 mmol/L (ref 98–111)
Creatinine, Ser: 0.9 mg/dL (ref 0.61–1.24)
GFR, Estimated: >60 mL/min (ref >60)
Glucose, Bld: 113 mg/dL — ABNORMAL HIGH (ref 70–99)
Potassium: 4.5 mmol/L (ref 3.5–5.1)
Sodium: 140 mmol/L (ref 135–145)
Total Bilirubin: 0.5 mg/dL (ref 0.0–1.2)
Total Protein: 5.6 g/dL — ABNORMAL LOW (ref 6.5–8.1)

## 2024-03-27 LAB — I-STAT CHEM 8, ED
BUN: 24 mg/dL — ABNORMAL HIGH (ref 6–20)
Calcium, Ion: 1.2 mmol/L (ref 1.15–1.40)
Chloride: 105 mmol/L (ref 98–111)
Creatinine, Ser: 1 mg/dL (ref 0.61–1.24)
Glucose, Bld: 111 mg/dL — ABNORMAL HIGH (ref 70–99)
HCT: 40 % (ref 39.0–52.0)
Hemoglobin: 13.6 g/dL (ref 13.0–17.0)
Potassium: 4.5 mmol/L (ref 3.5–5.1)
Sodium: 142 mmol/L (ref 135–145)
TCO2: 26 mmol/L (ref 22–32)

## 2024-03-27 LAB — SAMPLE TO BLOOD BANK

## 2024-03-27 LAB — URINALYSIS, ROUTINE W REFLEX MICROSCOPIC
Bilirubin Urine: NEGATIVE
Glucose, UA: NEGATIVE mg/dL
Ketones, ur: NEGATIVE mg/dL
Leukocytes,Ua: NEGATIVE
Nitrite: NEGATIVE
Protein, ur: 100 mg/dL — AB
Specific Gravity, Urine: <1.005 — ABNORMAL LOW (ref 1.005–1.030)
pH: 6.5 (ref 5.0–8.0)

## 2024-03-27 LAB — TROPONIN I (HIGH SENSITIVITY): Troponin I (High Sensitivity): 72 ng/L — ABNORMAL HIGH (ref <18)

## 2024-03-27 LAB — PREPARE RBC (CROSSMATCH)

## 2024-03-27 LAB — ETHANOL: Alcohol, Ethyl (B): <15 mg/dL (ref <15)

## 2024-03-27 LAB — URINALYSIS, MICROSCOPIC (REFLEX)

## 2024-03-27 LAB — I-STAT CG4 LACTIC ACID, ED: Lactic Acid, Venous: 1.3 mmol/L (ref 0.5–1.9)

## 2024-03-27 LAB — PROTIME-INR
INR: 0.9 (ref 0.8–1.2)
Prothrombin Time: 13 s (ref 11.4–15.2)

## 2024-03-27 LAB — MRSA NEXT GEN BY PCR, NASAL: MRSA by PCR Next Gen: NOT DETECTED

## 2024-03-27 LAB — ABO/RH: ABO/RH(D): A POS

## 2024-03-27 SURGERY — INSERTION, ENDOVASCULAR STENT GRAFT, AORTA, THORACIC
Anesthesia: General | Site: Groin | Laterality: Right

## 2024-03-27 MED ORDER — OXYCODONE HCL 5 MG PO TABS: 5.0000 mg | ORAL_TABLET | ORAL | Status: DC | PRN

## 2024-03-27 MED ORDER — ONDANSETRON HCL 4 MG/2ML IJ SOLN
4.0000 mg | Freq: Four times a day (QID) | INTRAMUSCULAR | Status: DC | PRN
  Administered 2024-03-27 – 2024-03-29 (×2): 4 mg via INTRAVENOUS
  Filled 2024-03-27 (×2): qty 2

## 2024-03-27 MED ORDER — LIDOCAINE-EPINEPHRINE 1 %-1:100000 IJ SOLN
INTRAMUSCULAR | Status: DC | PRN
  Administered 2024-03-27: 1 mL

## 2024-03-27 MED ORDER — HYDROMORPHONE HCL 1 MG/ML IJ SOLN
0.5000 mg | INTRAMUSCULAR | Status: DC | PRN
  Administered 2024-03-27 – 2024-03-29 (×12): 1 mg via INTRAVENOUS
  Filled 2024-03-27 (×13): qty 1

## 2024-03-27 MED ORDER — HYDRALAZINE HCL 20 MG/ML IJ SOLN: 10.0000 mg | INTRAMUSCULAR | Status: DC | PRN

## 2024-03-27 MED ORDER — GABAPENTIN 300 MG PO CAPS: 300.0000 mg | ORAL_CAPSULE | Freq: Two times a day (BID) | ORAL | Status: DC

## 2024-03-27 MED ORDER — ESMOLOL HCL-SODIUM CHLORIDE 2000 MG/100ML IV SOLN
25.0000 ug/kg/min | INTRAVENOUS | Status: DC
  Administered 2024-03-27: 25 ug/kg/min via INTRAVENOUS
  Administered 2024-03-27: 200 ug/kg/min via INTRAVENOUS
  Administered 2024-03-27: 180 ug/kg/min via INTRAVENOUS
  Administered 2024-03-27: 150 ug/kg/min via INTRAVENOUS
  Administered 2024-03-28: 75 ug/kg/min via INTRAVENOUS
  Administered 2024-03-28: 125 ug/kg/min via INTRAVENOUS
  Filled 2024-03-27 (×2): qty 100
  Filled 2024-03-27: qty 200
  Filled 2024-03-27 (×4): qty 100

## 2024-03-27 MED ORDER — POLYETHYLENE GLYCOL 3350 17 G PO PACK: 17.0000 g | PACK | Freq: Every day | ORAL | Status: DC | PRN

## 2024-03-27 MED ORDER — PANTOPRAZOLE SODIUM 40 MG IV SOLR
40.0000 mg | Freq: Every day | INTRAVENOUS | Status: DC
  Administered 2024-03-27: 40 mg via INTRAVENOUS
  Filled 2024-03-27 (×2): qty 10

## 2024-03-27 MED ORDER — DULOXETINE HCL 30 MG PO CPEP
30.0000 mg | ORAL_CAPSULE | Freq: Every day | ORAL | Status: DC
  Administered 2024-03-29 – 2024-03-30 (×2): 30 mg via ORAL
  Filled 2024-03-27 (×3): qty 1

## 2024-03-27 MED ORDER — HEPARIN 6000 UNIT IRRIGATION SOLUTION
Status: DC | PRN
  Administered 2024-03-27: 1

## 2024-03-27 MED ORDER — IPRATROPIUM-ALBUTEROL 0.5-2.5 (3) MG/3ML IN SOLN: 3.0000 mL | Freq: Once | RESPIRATORY_TRACT | Status: DC

## 2024-03-27 MED ORDER — HYDROMORPHONE HCL 1 MG/ML IJ SOLN
INTRAMUSCULAR | Status: AC
  Filled 2024-03-27: qty 1

## 2024-03-27 MED ORDER — PHENYLEPHRINE HCL-NACL 20-0.9 MG/250ML-% IV SOLN
INTRAVENOUS | Status: DC | PRN
  Administered 2024-03-27: 60 ug/min via INTRAVENOUS

## 2024-03-27 MED ORDER — DEXAMETHASONE SOD PHOSPHATE PF 10 MG/ML IJ SOLN
INTRAMUSCULAR | Status: DC | PRN
  Administered 2024-03-27: 10 mg via INTRAVENOUS

## 2024-03-27 MED ORDER — LIDOCAINE 2% (20 MG/ML) 5 ML SYRINGE
INTRAMUSCULAR | Status: DC | PRN
  Administered 2024-03-27: 60 mg via INTRAVENOUS

## 2024-03-27 MED ORDER — HYDROMORPHONE HCL 1 MG/ML IJ SOLN
0.5000 mg | INTRAMUSCULAR | Status: DC | PRN
  Administered 2024-03-27 (×2): 1 mg via INTRAVENOUS
  Filled 2024-03-27 (×2): qty 1

## 2024-03-27 MED ORDER — CHLORHEXIDINE GLUCONATE CLOTH 2 % EX PADS
6.0000 | MEDICATED_PAD | Freq: Every day | CUTANEOUS | Status: DC
  Administered 2024-03-28 – 2024-03-30 (×3): 6 via TOPICAL

## 2024-03-27 MED ORDER — SODIUM CHLORIDE 0.9% IV SOLUTION: Freq: Once | INTRAVENOUS | Status: DC

## 2024-03-27 MED ORDER — FENTANYL CITRATE (PF) 250 MCG/5ML IJ SOLN
INTRAMUSCULAR | Status: DC | PRN
  Administered 2024-03-27: 50 ug via INTRAVENOUS
  Administered 2024-03-27: 25 ug via INTRAVENOUS
  Administered 2024-03-27: 50 ug via INTRAVENOUS
  Administered 2024-03-27: 125 ug via INTRAVENOUS

## 2024-03-27 MED ORDER — FENTANYL CITRATE (PF) 50 MCG/ML IJ SOSY
PREFILLED_SYRINGE | INTRAMUSCULAR | Status: AC
  Filled 2024-03-27: qty 2

## 2024-03-27 MED ORDER — CEFAZOLIN SODIUM-DEXTROSE 2-4 GM/100ML-% IV SOLN: 2.0000 g | Freq: Three times a day (TID) | INTRAVENOUS | Status: DC

## 2024-03-27 MED ORDER — SUCCINYLCHOLINE CHLORIDE 200 MG/10ML IV SOSY
PREFILLED_SYRINGE | INTRAVENOUS | Status: DC | PRN
  Administered 2024-03-27: 140 mg via INTRAVENOUS

## 2024-03-27 MED ORDER — METHOCARBAMOL 500 MG PO TABS
500.0000 mg | ORAL_TABLET | Freq: Three times a day (TID) | ORAL | Status: AC
  Administered 2024-03-27 – 2024-03-30 (×8): 500 mg via ORAL
  Filled 2024-03-27 (×8): qty 1

## 2024-03-27 MED ORDER — ACETAMINOPHEN 500 MG PO TABS
1000.0000 mg | ORAL_TABLET | Freq: Four times a day (QID) | ORAL | Status: DC
  Administered 2024-03-27 – 2024-03-30 (×12): 1000 mg via ORAL
  Filled 2024-03-27 (×12): qty 2

## 2024-03-27 MED ORDER — IPRATROPIUM-ALBUTEROL 0.5-2.5 (3) MG/3ML IN SOLN: 3.0000 mL | RESPIRATORY_TRACT | Status: DC

## 2024-03-27 MED ORDER — ASPIRIN 81 MG PO TBEC
81.0000 mg | DELAYED_RELEASE_TABLET | Freq: Every day | ORAL | Status: DC
  Administered 2024-03-28 – 2024-03-30 (×3): 81 mg via ORAL
  Filled 2024-03-27 (×3): qty 1

## 2024-03-27 MED ORDER — HYDROMORPHONE HCL 1 MG/ML IJ SOLN
1.0000 mg | Freq: Once | INTRAMUSCULAR | Status: AC
  Administered 2024-03-27: 1 mg via INTRAVENOUS

## 2024-03-27 MED ORDER — PREDNISONE 20 MG PO TABS: 20.0000 mg | ORAL_TABLET | ORAL | Status: DC

## 2024-03-27 MED ORDER — DEXMEDETOMIDINE HCL IN NACL 80 MCG/20ML IV SOLN
INTRAVENOUS | Status: DC | PRN
  Administered 2024-03-27: 10 ug via INTRAVENOUS

## 2024-03-27 MED ORDER — FENTANYL CITRATE (PF) 50 MCG/ML IJ SOSY
50.0000 ug | PREFILLED_SYRINGE | Freq: Once | INTRAMUSCULAR | Status: AC
  Administered 2024-03-27: 50 ug via INTRAVENOUS
  Filled 2024-03-27: qty 1

## 2024-03-27 MED ORDER — HEPARIN 6000 UNIT IRRIGATION SOLUTION
Status: AC
  Filled 2024-03-27: qty 1000

## 2024-03-27 MED ORDER — LIDOCAINE-EPINEPHRINE (PF) 1 %-1:200000 IJ SOLN
INTRAMUSCULAR | Status: AC
  Filled 2024-03-27: qty 30

## 2024-03-27 MED ORDER — ONDANSETRON 4 MG PO TBDP: 4.0000 mg | ORAL_TABLET | Freq: Four times a day (QID) | ORAL | Status: DC | PRN

## 2024-03-27 MED ORDER — CEFAZOLIN SODIUM-DEXTROSE 2-3 GM-%(50ML) IV SOLR
INTRAVENOUS | Status: DC | PRN
  Administered 2024-03-27: 2 g via INTRAVENOUS

## 2024-03-27 MED ORDER — LACTATED RINGERS IV SOLN: INTRAVENOUS | Status: DC

## 2024-03-27 MED ORDER — PANTOPRAZOLE SODIUM 40 MG PO TBEC
40.0000 mg | DELAYED_RELEASE_TABLET | Freq: Every day | ORAL | Status: DC
  Administered 2024-03-28 – 2024-03-30 (×3): 40 mg via ORAL
  Filled 2024-03-27 (×3): qty 1

## 2024-03-27 MED ORDER — HYDROMORPHONE HCL 1 MG/ML IJ SOLN
0.5000 mg | Freq: Once | INTRAMUSCULAR | Status: AC
  Administered 2024-03-27: 0.5 mg via INTRAVENOUS
  Filled 2024-03-27: qty 1

## 2024-03-27 MED ORDER — ROCURONIUM BROMIDE 10 MG/ML (PF) SYRINGE
PREFILLED_SYRINGE | INTRAVENOUS | Status: DC | PRN
  Administered 2024-03-27: 60 mg via INTRAVENOUS

## 2024-03-27 MED ORDER — MIDAZOLAM HCL 2 MG/2ML IJ SOLN
INTRAMUSCULAR | Status: AC
  Filled 2024-03-27: qty 2

## 2024-03-27 MED ORDER — DOCUSATE SODIUM 100 MG PO CAPS
100.0000 mg | ORAL_CAPSULE | Freq: Two times a day (BID) | ORAL | Status: DC
  Administered 2024-03-27 – 2024-03-30 (×7): 100 mg via ORAL
  Filled 2024-03-27 (×7): qty 1

## 2024-03-27 MED ORDER — OXYCODONE HCL 5 MG PO TABS
10.0000 mg | ORAL_TABLET | ORAL | Status: DC | PRN
  Administered 2024-03-27 – 2024-03-28 (×4): 10 mg via ORAL
  Filled 2024-03-27 (×4): qty 2

## 2024-03-27 MED ORDER — METHOCARBAMOL 1000 MG/10ML IJ SOLN
500.0000 mg | Freq: Three times a day (TID) | INTRAMUSCULAR | Status: AC
  Administered 2024-03-27: 500 mg via INTRAVENOUS
  Filled 2024-03-27: qty 10

## 2024-03-27 MED ORDER — ALPRAZOLAM 0.5 MG PO TABS
1.0000 mg | ORAL_TABLET | Freq: Two times a day (BID) | ORAL | Status: DC
  Administered 2024-03-27 – 2024-03-30 (×6): 1 mg via ORAL
  Filled 2024-03-27 (×6): qty 2

## 2024-03-27 MED ORDER — GABAPENTIN 300 MG PO CAPS
300.0000 mg | ORAL_CAPSULE | Freq: Two times a day (BID) | ORAL | Status: DC
  Administered 2024-03-27 – 2024-03-30 (×7): 300 mg via ORAL
  Filled 2024-03-27 (×7): qty 1

## 2024-03-27 MED ORDER — METOPROLOL TARTRATE 5 MG/5ML IV SOLN: 5.0000 mg | Freq: Four times a day (QID) | INTRAVENOUS | Status: DC | PRN

## 2024-03-27 MED ORDER — FENTANYL CITRATE (PF) 250 MCG/5ML IJ SOLN
INTRAMUSCULAR | Status: AC
  Filled 2024-03-27: qty 5

## 2024-03-27 MED ORDER — BACITRACIN ZINC 500 UNIT/GM EX OINT
TOPICAL_OINTMENT | CUTANEOUS | Status: AC
  Filled 2024-03-27: qty 28.35

## 2024-03-27 MED ORDER — PHENYLEPHRINE 80 MCG/ML (10ML) SYRINGE FOR IV PUSH (FOR BLOOD PRESSURE SUPPORT)
PREFILLED_SYRINGE | INTRAVENOUS | Status: DC | PRN
  Administered 2024-03-27: 80 ug via INTRAVENOUS
  Administered 2024-03-27: 160 ug via INTRAVENOUS
  Administered 2024-03-27 (×3): 80 ug via INTRAVENOUS
  Administered 2024-03-27: 160 ug via INTRAVENOUS
  Administered 2024-03-27 (×2): 80 ug via INTRAVENOUS

## 2024-03-27 MED ORDER — ETOMIDATE 2 MG/ML IV SOLN
INTRAVENOUS | Status: DC | PRN
  Administered 2024-03-27: 18 mg via INTRAVENOUS

## 2024-03-27 MED ORDER — VASOPRESSIN 20 UNIT/ML IV SOLN
INTRAVENOUS | Status: DC | PRN
  Administered 2024-03-27: .5 [IU] via INTRAVENOUS

## 2024-03-27 MED ORDER — IOHEXOL 350 MG/ML SOLN
125.0000 mL | Freq: Once | INTRAVENOUS | Status: AC | PRN
  Administered 2024-03-27: 125 mL via INTRAVENOUS

## 2024-03-27 MED ORDER — AMISULPRIDE (ANTIEMETIC) 5 MG/2ML IV SOLN
10.0000 mg | Freq: Once | INTRAVENOUS | Status: AC | PRN
  Administered 2024-03-27: 10 mg via INTRAVENOUS

## 2024-03-27 MED ORDER — SUGAMMADEX SODIUM 200 MG/2ML IV SOLN
INTRAVENOUS | Status: DC | PRN
  Administered 2024-03-27 (×2): 200 mg via INTRAVENOUS

## 2024-03-27 MED ORDER — LIDOCAINE-EPINEPHRINE 1 %-1:100000 IJ SOLN
INTRAMUSCULAR | Status: AC
  Filled 2024-03-27: qty 1

## 2024-03-27 MED ORDER — PROTHROMBIN COMPLEX CONC HUMAN 500 UNITS IV KIT
3937.0000 [IU] | PACK | Status: AC
  Administered 2024-03-27: 3937 [IU] via INTRAVENOUS
  Filled 2024-03-27: qty 3937

## 2024-03-27 MED ORDER — IPRATROPIUM-ALBUTEROL 0.5-2.5 (3) MG/3ML IN SOLN
RESPIRATORY_TRACT | Status: AC
  Filled 2024-03-27: qty 3

## 2024-03-27 MED ORDER — ONDANSETRON HCL 4 MG/2ML IJ SOLN: 4.0000 mg | Freq: Once | INTRAMUSCULAR | Status: DC | PRN

## 2024-03-27 MED ORDER — AMISULPRIDE (ANTIEMETIC) 5 MG/2ML IV SOLN
INTRAVENOUS | Status: AC
  Filled 2024-03-27: qty 4

## 2024-03-27 MED ORDER — 0.9 % SODIUM CHLORIDE (POUR BTL) OPTIME
TOPICAL | Status: DC | PRN
  Administered 2024-03-27: 1000 mL

## 2024-03-27 MED ORDER — FENTANYL CITRATE (PF) 100 MCG/2ML IJ SOLN: 25.0000 ug | INTRAMUSCULAR | Status: DC | PRN

## 2024-03-27 MED ORDER — IODIXANOL 320 MG/ML IV SOLN
INTRAVENOUS | Status: DC | PRN
  Administered 2024-03-27: 49 mL via INTRA_ARTERIAL

## 2024-03-27 MED ORDER — LACTATED RINGERS IV SOLN
INTRAVENOUS | Status: AC | PRN
  Administered 2024-03-27: 1000 mL via INTRAVENOUS

## 2024-03-27 MED ORDER — PIPERACILLIN-TAZOBACTAM 3.375 G IVPB
3.3750 g | Freq: Three times a day (TID) | INTRAVENOUS | Status: DC
  Administered 2024-03-27 – 2024-03-30 (×9): 3.375 g via INTRAVENOUS
  Filled 2024-03-27 (×9): qty 50

## 2024-03-27 MED ORDER — MIDAZOLAM HCL (PF) 2 MG/2ML IJ SOLN: 2.0000 mg | INTRAMUSCULAR | Status: DC | PRN

## 2024-03-27 MED ORDER — PROTAMINE SULFATE 10 MG/ML IV SOLN
INTRAVENOUS | Status: DC | PRN
  Administered 2024-03-27: 50 mg via INTRAVENOUS

## 2024-03-27 SURGICAL SUPPLY — 46 items
BAG COUNTER SPONGE SURGICOUNT (BAG) ×4 IMPLANT
CANISTER SUCTION 3000ML PPV (SUCTIONS) ×4 IMPLANT
CATH ACCU-VU SIZ PIG 5F 100CM (CATHETERS) IMPLANT
CLIP TI MEDIUM 6 (CLIP) ×4 IMPLANT
CLIP TI WIDE RED SMALL 6 (CLIP) ×4 IMPLANT
CORD BIPOLAR FORCEPS 12FT (ELECTRODE) IMPLANT
DERMABOND ADVANCED .7 DNX12 (GAUZE/BANDAGES/DRESSINGS) ×4 IMPLANT
DEVICE CLOSURE PERCLS PRGLD 6F (Vascular Products) ×8 IMPLANT
DEVICE TORQUE KENDALL .025-038 (MISCELLANEOUS) IMPLANT
DRAPE BRACHIAL (DRAPES) IMPLANT
DRAPE C-ARM 42X72 X-RAY (DRAPES) IMPLANT
ELECTRODE REM PT RTRN 9FT ADLT (ELECTROSURGICAL) ×8 IMPLANT
FORCEPS BIPOLAR SPETZLER 8 1.0 (NEUROSURGERY SUPPLIES) IMPLANT
GLOVE BIOGEL PI IND STRL 8 (GLOVE) ×4 IMPLANT
GOWN STRL NON-REIN LRG LVL3 (GOWN DISPOSABLE) ×4 IMPLANT
GOWN STRL REUS W/ TWL LRG LVL3 (GOWN DISPOSABLE) ×4 IMPLANT
GOWN STRL REUS W/TWL 2XL LVL3 (GOWN DISPOSABLE) ×4 IMPLANT
GRAFT BALLN CATH 65CM (BALLOONS) IMPLANT
HEMOSTAT SNOW SURGICEL 2X4 (HEMOSTASIS) IMPLANT
KIT BASIN OR (CUSTOM PROCEDURE TRAY) ×4 IMPLANT
KIT MARKER MARGIN INK (KITS) IMPLANT
KIT TURNOVER KIT B (KITS) ×4 IMPLANT
MARKER SKIN DUAL TIP RULER LAB (MISCELLANEOUS) IMPLANT
NDL HYPO 25GX1X1/2 BEV (NEEDLE) IMPLANT
PACK ENDOVASCULAR (PACKS) ×4 IMPLANT
PAD ARMBOARD POSITIONER FOAM (MISCELLANEOUS) ×8 IMPLANT
SET MICROPUNCTURE 5F STIFF (MISCELLANEOUS) ×4 IMPLANT
SHEATH AVANTI 11CM 8FR (SHEATH) ×4 IMPLANT
SHEATH DRYSEAL FLEX 20FR 33CM (SHEATH) IMPLANT
SHEATH PINNACLE 8F 10CM (SHEATH) IMPLANT
SOLN 0.9% NACL POUR BTL 1000ML (IV SOLUTION) ×8 IMPLANT
STENT GRFT THORAC ACS 31X31X15 (Endovascular Graft) IMPLANT
STOPCOCK MORSE 400PSI 3WAY (MISCELLANEOUS) ×4 IMPLANT
SUT CHROMIC 5 0 RB 1 27 (SUTURE) IMPLANT
SUT GUT PLAIN 6-0 1X18 ABS (SUTURE) IMPLANT
SUT MNCRL AB 4-0 PS2 18 (SUTURE) ×4 IMPLANT
SUT PLAIN GUT FAST 5-0 (SUTURE) IMPLANT
SUT SILK 3-0 18XBRD TIE 12 (SUTURE) IMPLANT
SUT VIC AB 4-0 PS2 18 (SUTURE) IMPLANT
SYR 20ML LL LF (SYRINGE) IMPLANT
SYR 30ML LL (SYRINGE) IMPLANT
SYR CONTROL 10ML LL (SYRINGE) IMPLANT
TOWEL GREEN STERILE (TOWEL DISPOSABLE) ×4 IMPLANT
TUBING INJECTOR 48 (MISCELLANEOUS) IMPLANT
WIRE BENTSON .035X145CM (WIRE) ×4 IMPLANT
WIRE STIFF LUNDERQUIST 260CM (WIRE) ×4 IMPLANT

## 2024-03-27 NOTE — ED Notes (Addendum)
 Pt gave Verbal Consent for surgery w/ Dr Lanis at bedside.
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
## 2024-03-27 NOTE — ED Notes (Addendum)
 Dr Lanis at bedside.

## 2024-03-27 NOTE — ED Notes (Signed)
 Dr sebastian removed c-collar
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
## 2024-03-27 NOTE — Anesthesia Postprocedure Evaluation (Signed)
 Anesthesia Post Note  Patient: Tyler Bowman  Procedure(s) Performed: INSERTION, ENDOVASCULAR STENT GRAFT, AORTA, THORACIC ULTRASOUND GUIDANCE, FOR VASCULAR ACCESS (Right: Groin) COMPLEX CLOSURE, LEFT UPPER EYELID AND BROW LACERATION 8 CM (Left: Eye)     Patient location during evaluation: PACU Anesthesia Type: General Level of consciousness: awake and alert Pain management: pain level controlled Vital Signs Assessment: post-procedure vital signs reviewed and stable Respiratory status: spontaneous breathing, nonlabored ventilation and respiratory function stable Cardiovascular status: blood pressure returned to baseline and stable Postop Assessment: no apparent nausea or vomiting Anesthetic complications: no   No notable events documented.  Last Vitals:  Vitals:   03/27/24 1300 03/27/24 1555  BP: 118/67   Pulse: 79   Resp: (!) 28   Temp:  37.2 C  SpO2: 93%     Last Pain:  Vitals:   03/27/24 1555  TempSrc: Oral  PainSc:                  Garnette FORBES Skillern

## 2024-03-27 NOTE — ED Notes (Signed)
 TRN cleaned facial wounds.

## 2024-03-27 NOTE — Anesthesia Procedure Notes (Signed)
 Procedure Name: Intubation Date/Time: 03/27/2024 10:17 AM  Performed by: Roslynn Waddell LABOR, CRNAPre-anesthesia Checklist: Patient identified, Emergency Drugs available, Suction available and Patient being monitored Patient Re-evaluated:Patient Re-evaluated prior to induction Oxygen Delivery Method: Circle System Utilized Preoxygenation: Pre-oxygenation with 100% oxygen Induction Type: IV induction Ventilation: Mask ventilation without difficulty Laryngoscope Size: Glidescope and 3 Grade View: Grade I Tube type: Oral Tube size: 8.0 mm Number of attempts: 1 Airway Equipment and Method: Stylet and Oral airway Placement Confirmation: ETT inserted through vocal cords under direct vision, positive ETCO2 and breath sounds checked- equal and bilateral Secured at: 23 cm Tube secured with: Tape Dental Injury: Teeth and Oropharynx as per pre-operative assessment  Comments: GS used d/t emergent case and RSI. Atraumatic induction/intubation. Edentulous upper and poor dentition lower. Dentition and oral mucosa as per preop.
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
## 2024-03-27 NOTE — ED Notes (Signed)
 OR ready for pt. TRN taking pt at this time.

## 2024-03-27 NOTE — ED Notes (Signed)
 Tyler Bowman spoke with Vascular at this time.
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
## 2024-03-27 NOTE — ED Notes (Signed)
 Patient transported to CT scan .
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
## 2024-03-27 NOTE — Transfer of Care (Signed)
 Immediate Anesthesia Transfer of Care Note  Patient: Tyler Bowman  Procedure(s) Performed: INSERTION, ENDOVASCULAR STENT GRAFT, AORTA, THORACIC COMPLEX CLOSURE, LEFT UPPER EYELID AND BROW LACERATION 8 CM (Left: Eye)  Patient Location: PACU  Anesthesia Type:General  Level of Consciousness: drowsy  Airway & Oxygen Therapy: Patient Spontanous Breathing and Patient connected to face mask oxygen  Post-op Assessment: Report given to RN and Post -op Vital signs reviewed and stable  Post vital signs: Reviewed and stable  Last Vitals:  Vitals Value Taken Time  BP 108/66 03/27/24 11:47  Temp    Pulse 76 03/27/24 11:50  Resp 21 03/27/24 11:50  SpO2 99 % 03/27/24 11:50  Vitals shown include unfiled device data.  Last Pain:  Vitals:   03/27/24 0957  TempSrc: Oral  PainSc:          Complications: No notable events documented.

## 2024-03-27 NOTE — ED Notes (Signed)
 Report called to [place], RN on 2H. Awaiting OR to call back once previous pt is off the table and room is clean.

## 2024-03-27 NOTE — Progress Notes (Signed)
 Orthopedic Tech Progress Note Patient Details:  KIMBERLEY DASTRUP [birth year] [id number]  Patient ID: Ozell FORBES Molt, male   DOB: February 20, 1976, 48 y.o.   MRN: [id number] I attended trauma page. Chandra Dorn PARAS 03/27/2024, 6:59 AM
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
## 2024-03-27 NOTE — ED Notes (Signed)
 C-collar applied

## 2024-03-27 NOTE — ED Notes (Signed)
 Return from CT with TRN.

## 2024-03-27 NOTE — Procedures (Signed)
 Preop diagnosis: Left upper eyelid and brow laceration Postop diagnosis: same Procedure: Complex closure of left upper eyelid and brow laceration, 8 cm Surgeon: Carlie Anesth: General Compl: None Findings: Laceration of upper eyelid along lid margin from lateral canthus to above medial canthus where the wound is larger with tissue loss and active bleeding.  Upper lid tissues pulled medially for closure. Description:  Consultation was performed intra-op and the procedure performed simultaneous to the vascular surgery.  The left upper face was prepped and draped in sterile fashion.  The upper lid/brow wound was cleared of clot and there was active bleeding.  The source of bleeding was identified and ligated in the depth of the wound using 3-0 silk.  The wound was copiously irrigated with saline.  The upper eyelid marginal laceration was closed with running 6-0 plain gut.  The medial wound appeared to include tissue loss.  The soft tissues of the upper lid were pulled medially and closed using 4-0 Vicryl in a simple fashion.  The skin was closed with 5-0 plain gut in a simple, running fashion.  He was returned to anesthesia for wake-up, was extubated, and moved to the recovery room in stable condition.

## 2024-03-27 NOTE — ED Notes (Signed)
 EDP paged CTS at this time.

## 2024-03-27 NOTE — Anesthesia Procedure Notes (Signed)
 Arterial Line Insertion Start/End12/04/2023 10:15 AM, 03/27/2024 10:22 AM Performed by: Corinne Garnette BRAVO, MD, anesthesiologist  Patient location: OR. Preanesthetic checklist: patient identified, IV checked, site marked, risks and benefits discussed, surgical consent, monitors and equipment checked, pre-op evaluation, timeout performed and anesthesia consent Left, radial was placed Catheter size: 20 G Hand hygiene performed  and maximum sterile barriers used   Attempts: 2 Procedure performed using ultrasound to evaluate access site. Ultrasound Notes:relevant anatomy identified, ultrasound used to visualize needle entry, vessel patent under ultrasound and image(s) printed for medical record. Following insertion, dressing applied and Biopatch. Post procedure assessment: normal and unchanged  Post procedure complications: second provider assisted and unsuccessful attempts. Patient tolerated the procedure well with no immediate complications. Additional procedure comments: Attempt x1 by CRNA with venous access. Catheter removed. Attempt x1 by MDA under US  guidance.SABRA
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
## 2024-03-27 NOTE — ED Notes (Signed)
CTS at bedside
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
## 2024-03-28 ENCOUNTER — Inpatient Hospital Stay (HOSPITAL_COMMUNITY)

## 2024-03-28 LAB — CBC
HCT: 28.3 % — ABNORMAL LOW (ref 39.0–52.0)
HCT: 27.2 % — ABNORMAL LOW (ref 39.0–52.0)
Hemoglobin: 9.4 g/dL — ABNORMAL LOW (ref 13.0–17.0)
Hemoglobin: 9 g/dL — ABNORMAL LOW (ref 13.0–17.0)
MCH: 32.3 pg (ref 26.0–34.0)
MCH: 32.3 pg (ref 26.0–34.0)
MCHC: 33.2 g/dL (ref 30.0–36.0)
MCHC: 33.1 g/dL (ref 30.0–36.0)
MCV: 97.3 fL (ref 80.0–100.0)
MCV: 97.5 fL (ref 80.0–100.0)
Platelets: 159 K/uL (ref 150–400)
Platelets: 135 K/uL — ABNORMAL LOW (ref 150–400)
RBC: 2.91 MIL/uL — ABNORMAL LOW (ref 4.22–5.81)
RBC: 2.79 MIL/uL — ABNORMAL LOW (ref 4.22–5.81)
RDW: 14.7 % (ref 11.5–15.5)
RDW: 14.8 % (ref 11.5–15.5)
WBC: 22.7 K/uL — ABNORMAL HIGH (ref 4.0–10.5)
WBC: 20.1 K/uL — ABNORMAL HIGH (ref 4.0–10.5)
nRBC: 0 % (ref 0.0–0.2)
nRBC: 0 % (ref 0.0–0.2)

## 2024-03-28 LAB — BASIC METABOLIC PANEL WITH GFR
Anion gap: 6 (ref 5–15)
BUN: 19 mg/dL (ref 6–20)
CO2: 27 mmol/L (ref 22–32)
Calcium: 8.3 mg/dL — ABNORMAL LOW (ref 8.9–10.3)
Chloride: 107 mmol/L (ref 98–111)
Creatinine, Ser: 0.99 mg/dL (ref 0.61–1.24)
GFR, Estimated: >60 mL/min (ref >60)
Glucose, Bld: 114 mg/dL — ABNORMAL HIGH (ref 70–99)
Potassium: 4.3 mmol/L (ref 3.5–5.1)
Sodium: 140 mmol/L (ref 135–145)

## 2024-03-28 MED ORDER — OXYCODONE HCL 5 MG PO TABS
5.0000 mg | ORAL_TABLET | ORAL | Status: DC | PRN
  Administered 2024-03-28 – 2024-03-30 (×11): 10 mg via ORAL
  Filled 2024-03-28 (×11): qty 2

## 2024-03-28 MED ORDER — IOHEXOL 350 MG/ML SOLN
100.0000 mL | Freq: Once | INTRAVENOUS | Status: AC | PRN
  Administered 2024-03-28: 100 mL via INTRAVENOUS

## 2024-03-28 MED ORDER — BACITRACIN ZINC 500 UNIT/GM EX OINT
TOPICAL_OINTMENT | Freq: Two times a day (BID) | CUTANEOUS | Status: DC
  Administered 2024-03-28 – 2024-03-30 (×5): 31.5 via TOPICAL
  Filled 2024-03-28: qty 28.4

## 2024-03-28 MED ORDER — IOHEXOL 350 MG/ML SOLN: 75.0000 mL | Freq: Once | INTRAVENOUS | Status: DC | PRN

## 2024-03-28 MED ORDER — POLYETHYLENE GLYCOL 3350 17 G PO PACK
17.0000 g | PACK | Freq: Every day | ORAL | Status: DC
  Administered 2024-03-29: 17 g via ORAL
  Filled 2024-03-28: qty 1
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
## 2024-03-28 NOTE — Progress Notes (Signed)
 Subjective: Feeling pretty good, extubated.  Objective: Vital signs in last 24 hours: Temp:  [97.7 F (36.5 C)-99 F (37.2 C)] 97.7 F (36.5 C) (12/02 1122) Pulse Rate:  [64-97] 86 (12/02 1400) Resp:  [14-28] 21 (12/02 1400) BP: (97-136)/(44-118) 136/118 (12/02 1330) SpO2:  [79 %-97 %] 94 % (12/02 1400) Arterial Line BP: (106-157)/(44-107) 122/103 (12/02 1400) Wt Readings from Last 1 Encounters:  03/27/24 82.5 kg    Intake/Output from previous day: 12/01 0701 - 12/02 0700 In: 4310 [I.V.:4148.9; IV Piggyback:161.1] Out: 5015 [Urine:5000; Blood:15] Intake/Output this shift: Total I/O In: 750 [P.O.:750] Out: 2350 [Urine:2350]  Head: Left facial abrasions, left upper lid/brow laceration healing, edema keeps eyelid closed, good gross vision with eyelid held open, EOMI.  Recent Labs    03/28/24 0500 03/28/24 1130  WBC 22.7* 20.1*  HGB 9.4* 9.0*  HCT 28.3* 27.2*  PLT 159 135*    Recent Labs    03/27/24 0642 03/27/24 0649 03/28/24 0500  NA 140 142 140  K 4.5 4.5 4.3  CL 104 105 107  CO2 27  --  27  GLUCOSE 113* 111* 114*  BUN 22* 24* 19  CREATININE 0.90 1.00 0.99  CALCIUM 9.1  --  8.3*    Medications: I have reviewed the patient's current medications.  Assessment/Plan: S/p repair of left upper eyelid/brow laceration  Bacitracin  twice daily.  Follow-up with Dr. Luciano as outpatient.   LOS: 1 day   Vaughan Ricker 03/28/2024, 2:45 PM
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
## 2024-03-29 ENCOUNTER — Encounter (HOSPITAL_COMMUNITY): Payer: Self-pay | Admitting: General Surgery

## 2024-03-29 LAB — CBC
HCT: 28.6 % — ABNORMAL LOW (ref 39.0–52.0)
Hemoglobin: 9.5 g/dL — ABNORMAL LOW (ref 13.0–17.0)
MCH: 32.3 pg (ref 26.0–34.0)
MCHC: 33.2 g/dL (ref 30.0–36.0)
MCV: 97.3 fL (ref 80.0–100.0)
Platelets: 153 K/uL (ref 150–400)
RBC: 2.94 MIL/uL — ABNORMAL LOW (ref 4.22–5.81)
RDW: 14.9 % (ref 11.5–15.5)
WBC: 14.9 K/uL — ABNORMAL HIGH (ref 4.0–10.5)
nRBC: 0 % (ref 0.0–0.2)

## 2024-03-29 LAB — BASIC METABOLIC PANEL WITH GFR
Anion gap: 14 (ref 5–15)
BUN: 12 mg/dL (ref 6–20)
CO2: 23 mmol/L (ref 22–32)
Calcium: 8.5 mg/dL — ABNORMAL LOW (ref 8.9–10.3)
Chloride: 106 mmol/L (ref 98–111)
Creatinine, Ser: 0.85 mg/dL (ref 0.61–1.24)
GFR, Estimated: >60 mL/min (ref >60)
Glucose, Bld: 98 mg/dL (ref 70–99)
Potassium: 3.9 mmol/L (ref 3.5–5.1)
Sodium: 143 mmol/L (ref 135–145)

## 2024-03-29 MED ORDER — ENOXAPARIN SODIUM 40 MG/0.4ML IJ SOSY
40.0000 mg | PREFILLED_SYRINGE | Freq: Every day | INTRAMUSCULAR | Status: DC
  Administered 2024-03-29: 40 mg via SUBCUTANEOUS
  Filled 2024-03-29: qty 0.4

## 2024-03-29 MED ORDER — HYDROMORPHONE HCL 1 MG/ML IJ SOLN
0.5000 mg | INTRAMUSCULAR | Status: DC | PRN
  Administered 2024-03-29 (×4): 1 mg via INTRAVENOUS
  Filled 2024-03-29 (×5): qty 1
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
## 2024-03-29 NOTE — Progress Notes (Addendum)
  Progress Note    03/29/2024 9:33 AM 2 Days Post-Op  Subjective:  no events overnight   Vitals:   03/29/24 0700 03/29/24 0800  BP: (!) 135/59   Pulse: 88   Resp: (!) 23   Temp:  98.9 F (37.2 C)  SpO2: 94%    Physical Exam: Lungs:  non labored Incisions:  R groin c/d/i Extremities:  palpable L DP and R PT; moving all ext well Abdomen:  soft, NT, ND Neurologic: A&O; symmetric grip strength  CBC    Component Value Date/Time   WBC 20.1 (H) 03/28/2024 1130   RBC 2.79 (L) 03/28/2024 1130   HGB 9.0 (L) 03/28/2024 1130   HCT 27.2 (L) 03/28/2024 1130   PLT 135 (L) 03/28/2024 1130   MCV 97.5 03/28/2024 1130   MCH 32.3 03/28/2024 1130   MCHC 33.1 03/28/2024 1130   RDW 14.8 03/28/2024 1130   LYMPHSABS 4.3 (H) 03/16/2024 1023   MONOABS 1.3 (H) 03/16/2024 1023   EOSABS 0.3 03/16/2024 1023   BASOSABS 0.1 03/16/2024 1023    BMET    Component Value Date/Time   NA 140 03/28/2024 0500   K 4.3 03/28/2024 0500   CL 107 03/28/2024 0500   CO2 27 03/28/2024 0500   GLUCOSE 114 (H) 03/28/2024 0500   BUN 19 03/28/2024 0500   CREATININE 0.99 03/28/2024 0500   CALCIUM 8.3 (L) 03/28/2024 0500   GFRNONAA >60 03/28/2024 0500   GFRAA >60 08/02/2017 2003    INR    Component Value Date/Time   INR 0.9 03/27/2024 0642     Intake/Output Summary (Last 24 hours) at 03/29/2024 0933 Last data filed at 03/29/2024 0400 Gross per 24 hour  Intake 727.18 ml  Output 4675 ml  Net -3947.82 ml     Assessment/Plan:  48 y.o. male is s/p TEVAR for blunt traumatic aortic injury  2 Days Post-Op   Tyler Bowman is doing well this morning.  Bilateral lower extremities are well-perfused with palpable left DP pulse and right PT pulse.  Right groin incision is healing well without hematoma.  He is moving all his extremities well.  Repeat CTA demonstrates successful repair of thoracic aorta without any evidence of bleeding.  Encouraged out of bed.  Continue aspirin.  We will repeat CTA chest abdomen  pelvis in 1 month.  Okay for discharge when cleared medically.    Tyler Sender, PA-C [facility] [phone] 03/29/2024 9:33 AM  VASCULAR STAFF ADDENDUM: I have independently interviewed and examined the patient. I agree with the above.  CT reviewed.  Happy with repair.  Will follow-up in 1 month's time with repeat CT due to intramural hematoma in the visceral aorta.  Patient can be normotensive.  No restrictions.  Discharge when medically ready.  Fonda FORBES Rim MD [facility] Phone Number: [phone] 03/29/2024 5:06 PM

## 2024-03-30 ENCOUNTER — Other Ambulatory Visit (HOSPITAL_COMMUNITY): Payer: Self-pay

## 2024-03-30 LAB — CBC
HCT: 30.1 % — ABNORMAL LOW (ref 39.0–52.0)
Hemoglobin: 9.9 g/dL — ABNORMAL LOW (ref 13.0–17.0)
MCH: 31.6 pg (ref 26.0–34.0)
MCHC: 32.9 g/dL (ref 30.0–36.0)
MCV: 96.2 fL (ref 80.0–100.0)
Platelets: 183 K/uL (ref 150–400)
RBC: 3.13 MIL/uL — ABNORMAL LOW (ref 4.22–5.81)
RDW: 14.5 % (ref 11.5–15.5)
WBC: 14.1 K/uL — ABNORMAL HIGH (ref 4.0–10.5)
nRBC: 0.1 % (ref 0.0–0.2)

## 2024-03-30 MED ORDER — DOCUSATE SODIUM 100 MG PO CAPS: 100.0000 mg | ORAL_CAPSULE | Freq: Two times a day (BID) | ORAL | Status: AC | PRN

## 2024-03-30 MED ORDER — ASPIRIN 81 MG PO TBEC
81.0000 mg | DELAYED_RELEASE_TABLET | Freq: Every day | ORAL | 1 refills | Status: DC
  Filled 2024-03-30: qty 90, 90d supply, fill #0

## 2024-03-30 MED ORDER — HYDROMORPHONE HCL 1 MG/ML IJ SOLN
0.5000 mg | INTRAMUSCULAR | Status: DC | PRN
  Administered 2024-03-30: 0.5 mg via INTRAVENOUS
  Administered 2024-03-30 (×2): 1 mg via INTRAVENOUS
  Filled 2024-03-30 (×2): qty 1

## 2024-03-30 MED ORDER — PIPERACILLIN-TAZOBACTAM IV (FOR PTA / DISCHARGE USE ONLY): 13.5000 g | INTRAVENOUS | 0 refills | Status: AC

## 2024-03-30 MED ORDER — OXYCODONE HCL 10 MG PO TABS
10.0000 mg | ORAL_TABLET | Freq: Four times a day (QID) | ORAL | 0 refills | Status: DC | PRN
  Filled 2024-03-30: qty 20, 5d supply, fill #0

## 2024-03-30 MED ORDER — BACITRACIN ZINC 500 UNIT/GM EX OINT
TOPICAL_OINTMENT | Freq: Two times a day (BID) | CUTANEOUS | 0 refills | Status: DC
  Filled 2024-03-30: qty 28.4, 30d supply, fill #0

## 2024-03-30 MED ORDER — ACETAMINOPHEN 500 MG PO TABS: 1000.0000 mg | ORAL_TABLET | Freq: Three times a day (TID) | ORAL | Status: AC | PRN

## 2024-03-30 NOTE — Progress Notes (Signed)
 Patient's IV removed and discharge education given.

## 2024-03-30 NOTE — Plan of Care (Signed)
  Problem: Education: Goal: Knowledge of General Education information will improve Description: Including pain rating scale, medication(s)/side effects and non-pharmacologic comfort measures 03/30/2024 1032 by Ann Cleopatra BROCKS, RN Outcome: Progressing 03/30/2024 1032 by Ann Cleopatra BROCKS, RN Outcome: Progressing 03/30/2024 1032 by Ann Cleopatra BROCKS, RN Outcome: Progressing

## 2024-03-30 NOTE — Progress Notes (Incomplete Revision)
..  Trauma Event Note    Reason for Call : Called by primary RN regarding additional pain control for pt, he continues to c/o chest discomfort unrelieved by current regiment. Dr. Teresa TMD updated, changes made to pan medication orders. Ok to order CXR in AM if pt continues to c/o pain. Informed by primary RN charge nurse witnessed pt remove something from his pocket, appear to place it in his mouth and drink water.  Upon my entry to room pt sleeping soundly, pt holding onto heavy outdoor overalls in the bed.  Pt did wake to verbal stimuli, very drowsy. Pt admitted to removing a Phenergan  from a silver/black pill container found in the patients bed. Container now empty, overalls searched, with pts permission. no other concerning items found. Pt encouraged to reach out to staff should he have any further pharmaceutical needs. Pt verbalized understanding.  I was also informed by primary RN a small straw in patients sock on another occasion.   TMD White updated on findings.   Last imported Vital Signs BP 120/67 (BP Location: Right Arm)   Pulse 89   Temp 97.9 F (36.6 C) (Axillary)   Resp (!) 24   Ht 5' 10 (1.778 m)   Wt 181 lb 14.1 oz (82.5 kg)   SpO2 93%   BMI 26.10 kg/m   Trending CBC Recent Labs    03/28/24 1130 03/29/24 0848 03/30/24 0330  WBC 20.1* 14.9* 14.1*  HGB 9.0* 9.5* 9.9*  HCT 27.2* 28.6* 30.1*  PLT 135* 153 183    Trending Coag's Recent Labs    03/27/24 0642  INR 0.9    Trending BMET Recent Labs    03/27/24 0642 03/27/24 0649 03/28/24 0500 03/29/24 0848  NA 140 142 140 143  K 4.5 4.5 4.3 3.9  CL 104 105 107 106  CO2 27  --  27 23  BUN 22* 24* 19 12  CREATININE 0.90 1.00 0.99 0.85  GLUCOSE 113* 111* 114* 98      Tyler Bowman Dee  Trauma Response RN  Please call TRN at [phone] for further assistance.

## 2024-03-30 NOTE — TOC CM/SW Note (Signed)
 Per [employer], patient's medical records securely emailed to [employer]. Luster with the [employer].  [email or web address]).    Mliss MICAEL Fass, RN, BSN  Trauma/Neuro ICU Case Manager [phone]
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
## 2024-03-30 NOTE — Progress Notes (Signed)
..  Trauma Event Note    Reason for Call : Called by primary RN regarding additional pain control for pt, he continues to c/o chest discomfort unrelieved by current regiment. Dr. Teresa TMD updated, changes made to pan medication orders. Ok to order CXR in AM if pt continues to c/o pain. Informed by primary RN charge nurse witnessed pt remove something from his pocket, appear to place it in his mouth and drink water.  Upon my entry to room pt sleeping soundly, pt holding onto heavy outdoor overalls in the bed.  Pt did wake to verbal stimuli, very drowsy. Pt admitted to removing a Phenergan  from a silver/black pill container found in the patients bed. Container now empty, overalls searched, with pts permission. no other concerning items found. Pt encouraged to reach out to staff should he have any further pharmaceutical needs. Pt verbalized understanding.  I was also informed by primary RN a small straw in patients sock on another occasion.   TMD White updated on findings.   Last imported Vital Signs BP 120/67 (BP Location: Right Arm)   Pulse 89   Temp 97.9 F (36.6 C) (Axillary)   Resp (!) 24   Ht 5' 10 (1.778 m)   Wt 181 lb 14.1 oz (82.5 kg)   SpO2 93%   BMI 26.10 kg/m   Trending CBC Recent Labs    03/28/24 1130 03/29/24 0848 03/30/24 0330  WBC 20.1* 14.9* 14.1*  HGB 9.0* 9.5* 9.9*  HCT 27.2* 28.6* 30.1*  PLT 135* 153 183    Trending Coag's Recent Labs    03/27/24 0642  INR 0.9    Trending BMET Recent Labs    03/27/24 0642 03/27/24 0649 03/28/24 0500 03/29/24 0848  NA 140 142 140 143  K 4.5 4.5 4.3 3.9  CL 104 105 107 106  CO2 27  --  27 23  BUN 22* 24* 19 12  CREATININE 0.90 1.00 0.99 0.85  GLUCOSE 113* 111* 114* 98      Tyler Bowman  Trauma Response RN  Please call TRN at [phone] for further assistance.
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
## 2024-03-30 NOTE — Progress Notes (Signed)
 PHARMACY CONSULT NOTE FOR:  OUTPATIENT  PARENTERAL ANTIBIOTIC THERAPY (OPAT)  Indication: Mandibular osteo Regimen: Zosyn  13.5g daily as a continuous infusion End date: 04/27/24  IV antibiotic discharge orders are pended. To discharging provider:  please sign these orders via discharge navigator,  Select New Orders & click on the button choice - Manage This Unsigned Work.     Thank you for allowing pharmacy to be a part of this patient's care.  Almarie Lunger, PharmD, BCPS, BCIDP Infectious Diseases Clinical Pharmacist 03/30/2024 11:00 AM   **Pharmacist phone directory can now be found on [email or web address] ([email or web address]).  Listed under [facility].

## 2024-03-31 LAB — BPAM RBC
Blood Product Expiration Date: 202512222359
Blood Product Expiration Date: 202512222359
Blood Product Expiration Date: 202512222359
Blood Product Expiration Date: 202512232359
ISSUE DATE / TIME: 202512011012
ISSUE DATE / TIME: 202512011012
ISSUE DATE / TIME: 202512011029
ISSUE DATE / TIME: 202512011029
Unit Type and Rh: 6200
Unit Type and Rh: 6200
Unit Type and Rh: 6200
Unit Type and Rh: 6200

## 2024-03-31 LAB — TYPE AND SCREEN
ABO/RH(D): A POS
Antibody Screen: NEGATIVE
Unit division: 0
Unit division: 0
Unit division: 0
Unit division: 0

## 2024-04-01 ENCOUNTER — Other Ambulatory Visit: Payer: Self-pay

## 2024-04-01 ENCOUNTER — Emergency Department (HOSPITAL_COMMUNITY)

## 2024-04-01 ENCOUNTER — Inpatient Hospital Stay (HOSPITAL_COMMUNITY)

## 2024-04-01 ENCOUNTER — Inpatient Hospital Stay (HOSPITAL_COMMUNITY): Admission: EM | Admit: 2024-04-01 | Discharge: 2024-04-03 | DRG: 963 | Disposition: A

## 2024-04-01 ENCOUNTER — Encounter (HOSPITAL_COMMUNITY): Payer: Self-pay

## 2024-04-01 DIAGNOSIS — R0782 Intercostal pain: Secondary | ICD-10-CM

## 2024-04-01 DIAGNOSIS — R55 Syncope and collapse: Principal | ICD-10-CM | POA: Diagnosis present

## 2024-04-01 DIAGNOSIS — I824Y3 Acute embolism and thrombosis of unspecified deep veins of proximal lower extremity, bilateral: Secondary | ICD-10-CM

## 2024-04-01 LAB — COMPREHENSIVE METABOLIC PANEL WITH GFR
ALT: 78 U/L — ABNORMAL HIGH (ref 0–44)
AST: 30 U/L (ref 15–41)
Albumin: 3 g/dL — ABNORMAL LOW (ref 3.5–5.0)
Alkaline Phosphatase: 91 U/L (ref 38–126)
Anion gap: 9 (ref 5–15)
BUN: 13 mg/dL (ref 6–20)
CO2: 26 mmol/L (ref 22–32)
Calcium: 8.7 mg/dL — ABNORMAL LOW (ref 8.9–10.3)
Chloride: 101 mmol/L (ref 98–111)
Creatinine, Ser: 0.71 mg/dL (ref 0.61–1.24)
GFR, Estimated: >60 mL/min (ref >60)
Glucose, Bld: 100 mg/dL — ABNORMAL HIGH (ref 70–99)
Potassium: 3.9 mmol/L (ref 3.5–5.1)
Sodium: 136 mmol/L (ref 135–145)
Total Bilirubin: 0.5 mg/dL (ref 0.0–1.2)
Total Protein: 6.1 g/dL — ABNORMAL LOW (ref 6.5–8.1)

## 2024-04-01 LAB — CBC
HCT: 29.2 % — ABNORMAL LOW (ref 39.0–52.0)
Hemoglobin: 9.8 g/dL — ABNORMAL LOW (ref 13.0–17.0)
MCH: 32.1 pg (ref 26.0–34.0)
MCHC: 33.6 g/dL (ref 30.0–36.0)
MCV: 95.7 fL (ref 80.0–100.0)
Platelets: 300 K/uL (ref 150–400)
RBC: 3.05 MIL/uL — ABNORMAL LOW (ref 4.22–5.81)
RDW: 14 % (ref 11.5–15.5)
WBC: 11.8 K/uL — ABNORMAL HIGH (ref 4.0–10.5)
nRBC: 0 % (ref 0.0–0.2)

## 2024-04-01 LAB — PROTIME-INR
INR: 1 (ref 0.8–1.2)
Prothrombin Time: 13.4 s (ref 11.4–15.2)

## 2024-04-01 LAB — TROPONIN I (HIGH SENSITIVITY)
Troponin I (High Sensitivity): 56 ng/L — ABNORMAL HIGH (ref <18)
Troponin I (High Sensitivity): 51 ng/L — ABNORMAL HIGH (ref <18)

## 2024-04-01 LAB — LIPASE, BLOOD: Lipase: 19 U/L (ref 11–51)

## 2024-04-01 LAB — APTT: aPTT: 31 s (ref 24–36)

## 2024-04-01 LAB — I-STAT CG4 LACTIC ACID, ED: Lactic Acid, Venous: 0.9 mmol/L (ref 0.5–1.9)

## 2024-04-01 MED ORDER — ACETAMINOPHEN 500 MG PO TABS
1000.0000 mg | ORAL_TABLET | Freq: Four times a day (QID) | ORAL | Status: DC
  Administered 2024-04-01 – 2024-04-03 (×7): 1000 mg via ORAL
  Filled 2024-04-01 (×7): qty 2

## 2024-04-01 MED ORDER — DOCUSATE SODIUM 100 MG PO CAPS
100.0000 mg | ORAL_CAPSULE | Freq: Two times a day (BID) | ORAL | Status: DC
  Administered 2024-04-01 – 2024-04-03 (×4): 100 mg via ORAL
  Filled 2024-04-01 (×4): qty 1

## 2024-04-01 MED ORDER — PIPERACILLIN-TAZOBACTAM 3.375 G IVPB
3.3750 g | Freq: Three times a day (TID) | INTRAVENOUS | Status: DC
  Administered 2024-04-01 – 2024-04-03 (×6): 3.375 g via INTRAVENOUS
  Filled 2024-04-01 (×7): qty 50

## 2024-04-01 MED ORDER — METOPROLOL TARTRATE 5 MG/5ML IV SOLN: 5.0000 mg | Freq: Four times a day (QID) | INTRAVENOUS | Status: DC | PRN

## 2024-04-01 MED ORDER — HYDRALAZINE HCL 20 MG/ML IJ SOLN: 10.0000 mg | INTRAMUSCULAR | Status: DC | PRN

## 2024-04-01 MED ORDER — IOHEXOL 350 MG/ML SOLN
75.0000 mL | Freq: Once | INTRAVENOUS | Status: AC | PRN
  Administered 2024-04-01: 75 mL via INTRAVENOUS

## 2024-04-01 MED ORDER — METHOCARBAMOL 500 MG PO TABS
500.0000 mg | ORAL_TABLET | Freq: Three times a day (TID) | ORAL | Status: DC
  Administered 2024-04-01: 500 mg via ORAL
  Filled 2024-04-01: qty 1

## 2024-04-01 MED ORDER — OXYCODONE HCL 5 MG PO TABS: 5.0000 mg | ORAL_TABLET | ORAL | Status: DC | PRN

## 2024-04-01 MED ORDER — ALPRAZOLAM 0.25 MG PO TABS
1.0000 mg | ORAL_TABLET | Freq: Two times a day (BID) | ORAL | Status: DC | PRN
  Administered 2024-04-01 – 2024-04-03 (×4): 1 mg via ORAL
  Filled 2024-04-01 (×4): qty 4

## 2024-04-01 MED ORDER — OXYCODONE HCL 5 MG PO TABS
10.0000 mg | ORAL_TABLET | Freq: Once | ORAL | Status: AC
  Administered 2024-04-01: 10 mg via ORAL
  Filled 2024-04-01: qty 2

## 2024-04-01 MED ORDER — METHOCARBAMOL 1000 MG/10ML IJ SOLN
500.0000 mg | Freq: Three times a day (TID) | INTRAMUSCULAR | Status: DC
  Administered 2024-04-01: 500 mg via INTRAVENOUS
  Filled 2024-04-01: qty 10

## 2024-04-01 MED ORDER — ONDANSETRON HCL 4 MG/2ML IJ SOLN: 4.0000 mg | Freq: Four times a day (QID) | INTRAMUSCULAR | Status: DC | PRN

## 2024-04-01 MED ORDER — FUROSEMIDE 10 MG/ML IJ SOLN: 40.0000 mg | Freq: Once | INTRAMUSCULAR | Status: DC

## 2024-04-01 MED ORDER — HYDROMORPHONE HCL 1 MG/ML IJ SOLN
1.0000 mg | INTRAMUSCULAR | Status: DC | PRN
  Administered 2024-04-01 – 2024-04-02 (×3): 1 mg via INTRAVENOUS
  Filled 2024-04-01 (×3): qty 1

## 2024-04-01 MED ORDER — ONDANSETRON 4 MG PO TBDP: 4.0000 mg | ORAL_TABLET | Freq: Four times a day (QID) | ORAL | Status: DC | PRN

## 2024-04-01 MED ORDER — GABAPENTIN 300 MG PO CAPS
300.0000 mg | ORAL_CAPSULE | Freq: Three times a day (TID) | ORAL | Status: DC
  Administered 2024-04-01 – 2024-04-03 (×7): 300 mg via ORAL
  Filled 2024-04-01 (×7): qty 1

## 2024-04-01 MED ORDER — POLYETHYLENE GLYCOL 3350 17 G PO PACK: 17.0000 g | PACK | Freq: Every day | ORAL | Status: DC | PRN

## 2024-04-01 MED ORDER — OXYCODONE HCL 5 MG PO TABS
10.0000 mg | ORAL_TABLET | ORAL | Status: DC | PRN
  Administered 2024-04-01 – 2024-04-02 (×2): 10 mg via ORAL
  Filled 2024-04-01 (×2): qty 2

## 2024-04-01 MED ORDER — HEPARIN (PORCINE) 25000 UT/250ML-% IV SOLN
2000.0000 [IU]/h | INTRAVENOUS | Status: DC
  Administered 2024-04-01: 1450 [IU]/h via INTRAVENOUS
  Administered 2024-04-02: 1700 [IU]/h via INTRAVENOUS
  Filled 2024-04-01 (×3): qty 250

## 2024-04-01 NOTE — Progress Notes (Signed)
 Pharmacy Antibiotic Note  Tyler Bowman is a 48 y.o. male for which pharmacy has been consulted for zosyn  dosing for mandibular osteomyelitis.  Patient is on continuous zosyn  infusion outpatient. This is currently running.  Plan: Stop patient's home continuous zosyn  infusion and begin intermittent dosing. Zosyn  3.375g IV q8h (4 hour infusion) Monitor WBC, fever, renal function, cultures F/u ID recommendations  Height: 5' 10 (177.8 cm) Weight: 81.2 kg (179 lb) IBW/kg (Calculated) : 73  Temp (24hrs), Avg:98.9 F (37.2 C), Min:98.7 F (37.1 C), Max:99.1 F (37.3 C)  Recent Labs  Lab 03/27/24 0642 03/27/24 0649 03/27/24 0650 03/28/24 0500 03/28/24 1130 03/29/24 0848 03/30/24 0330 04/01/24 1030 04/01/24 1101  WBC 21.9*  --   --  22.7* 20.1* 14.9* 14.1* 11.8*  --   CREATININE 0.90 1.00  --  0.99  --  0.85  --  0.71  --   LATICACIDVEN  --   --  1.3  --   --   --   --   --  0.9    Estimated Creatinine Clearance: 115.3 mL/min (by C-G formula based on SCr of 0.71 mg/dL).    Allergies  Allergen Reactions   Aleve [Naproxen] Anaphylaxis, Swelling and Other (See Comments)    Edema, Syncope    Hydrocodone Itching and Swelling    Eye swelling   Remeron [Mirtazapine] Other (See Comments)    Nightmares    Microbiology results: Pending  Thank you for allowing pharmacy to be a part of this patient's care.  Dorn Buttner, PharmD, BCPS 04/01/2024 4:25 PM ED Clinical Pharmacist -  [phone]

## 2024-04-01 NOTE — Progress Notes (Signed)
 Transition of Care [facility]) - CAGE-AID Screening   Patient Details  Name: Tyler Bowman MRN: [id number] Date of Birth: 1976/03/27  [facility]) CM/SW Contact:    Bernardino Mayotte, RN Phone Number: 04/01/2024, 8:23 PM   Clinical Narrative:  Denies use of alcohol and illicit substances. Resources not given at this time.  CAGE-AID Screening:    Have You Ever Felt You Ought to Cut Down on Your Drinking or Drug Use?: No Have People Annoyed You By Critizing Your Drinking Or Drug Use?: No Have You Felt Bad Or Guilty About Your Drinking Or Drug Use?: No Have You Ever Had a Drink or Used Drugs First Thing In The Morning to Steady Your Nerves or to Get Rid of a Hangover?: No CAGE-AID Score: 0  Substance Abuse Education Offered: No
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
## 2024-04-01 NOTE — ED Triage Notes (Addendum)
 Pt states having hallucinations to left eye and had a syncopal. C/O dizziness/Cp/SHOB. Axox4. Pt has dried blood to forehead.

## 2024-04-01 NOTE — Progress Notes (Signed)
 Subjective: Patient called me this morning reporting an open appearance to his neck incision and a knot at the mandible along with fever to 101.8.  I recommended he come to the ER.  Subsequently, he noticed chest and back pain that he has reported to the ER staff.  Work-up is underway with CT of maxillofacial and chest planned.  Objective: Vital signs in last 24 hours: Temp:  [99.1 F (37.3 C)] 99.1 F (37.3 C) (12/06 0915) Pulse Rate:  [83-94] 87 (12/06 1100) Resp:  [11-22] 11 (12/06 1100) BP: (130-146)/(65-73) 130/65 (12/06 1100) SpO2:  [95 %-99 %] 98 % (12/06 1100) Weight:  [81.2 kg] 81.2 kg (12/06 0912) Wt Readings from Last 1 Encounters:  04/01/24 81.2 kg    Intake/Output from previous day: No intake/output data recorded. Intake/Output this shift: No intake/output data recorded.  General appearance: alert, cooperative, and no distress Head: left upper facial abrasions and healing left upper lid/brow laceration with less edema and able to open eye Neck: some subtle firmness under the left mandible margin, neck incision with slight dehiscence in midline region with no pus expressible  Recent Labs    03/30/24 0330 04/01/24 1030  WBC 14.1* 11.8*  HGB 9.9* 9.8*  HCT 30.1* 29.2*  PLT 183 300    No results for input(s): NA, K, CL, CO2, GLUCOSE, BUN, CREATININE, CALCIUM in the last 72 hours.  Medications: I have reviewed the patient's current medications.  Assessment/Plan: Left mandible osteomyelitis and recent left upper eyelid/brow repair  Exam of the mandible and neck is not particularly concerning.  WBC decreasing.  Will review maxillofacial CT when completed but do not anticipate need for further surgical management.  Continue home IV antibiotics and follow-up with Dr. Luciano.  Left upper eyelid/brow healing well.   LOS: 0 days   Vaughan Ricker 04/01/2024, 11:14 AM
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
## 2024-04-01 NOTE — Progress Notes (Signed)
 Patient ID: Tyler Bowman, male   DOB: 12/30/1975, 48 y.o.   MRN: [id number]  I personally reviewed his maxillofacial CT.  No new fluid collection requiring intervention.  Continue IV antibiotics and follow-up with Dr. Luciano.

## 2024-04-02 ENCOUNTER — Inpatient Hospital Stay (HOSPITAL_COMMUNITY)

## 2024-04-02 LAB — CBC
HCT: 33.1 % — ABNORMAL LOW (ref 39.0–52.0)
Hemoglobin: 11.3 g/dL — ABNORMAL LOW (ref 13.0–17.0)
MCH: 31.9 pg (ref 26.0–34.0)
MCHC: 34.1 g/dL (ref 30.0–36.0)
MCV: 93.5 fL (ref 80.0–100.0)
Platelets: 417 K/uL — ABNORMAL HIGH (ref 150–400)
RBC: 3.54 MIL/uL — ABNORMAL LOW (ref 4.22–5.81)
RDW: 14 % (ref 11.5–15.5)
WBC: 11.7 K/uL — ABNORMAL HIGH (ref 4.0–10.5)
nRBC: 0 % (ref 0.0–0.2)

## 2024-04-02 LAB — BASIC METABOLIC PANEL WITH GFR
Anion gap: 10 (ref 5–15)
BUN: 12 mg/dL (ref 6–20)
CO2: 26 mmol/L (ref 22–32)
Calcium: 8.9 mg/dL (ref 8.9–10.3)
Chloride: 103 mmol/L (ref 98–111)
Creatinine, Ser: 0.79 mg/dL (ref 0.61–1.24)
GFR, Estimated: >60 mL/min (ref >60)
Glucose, Bld: 127 mg/dL — ABNORMAL HIGH (ref 70–99)
Potassium: 4.3 mmol/L (ref 3.5–5.1)
Sodium: 139 mmol/L (ref 135–145)

## 2024-04-02 LAB — ECHOCARDIOGRAM COMPLETE
Area-P 1/2: 2.96 cm2
Calc EF: 62.2 %
Height: 70 in
S' Lateral: 2.5 cm
Single Plane A2C EF: 62.2 %
Single Plane A4C EF: 59.9 %
Weight: 2864 [oz_av]

## 2024-04-02 LAB — HEPARIN LEVEL (UNFRACTIONATED)
Heparin Unfractionated: 0.13 [IU]/mL — ABNORMAL LOW (ref 0.30–0.70)
Heparin Unfractionated: 0.24 [IU]/mL — ABNORMAL LOW (ref 0.30–0.70)

## 2024-04-02 LAB — APTT
aPTT: 47 s — ABNORMAL HIGH (ref 24–36)
aPTT: 53 s — ABNORMAL HIGH (ref 24–36)

## 2024-04-02 MED ORDER — HYDROMORPHONE HCL 1 MG/ML IJ SOLN: 0.5000 mg | INTRAMUSCULAR | Status: DC | PRN

## 2024-04-02 MED ORDER — HYDROMORPHONE HCL 1 MG/ML IJ SOLN
1.0000 mg | INTRAMUSCULAR | Status: DC | PRN
  Administered 2024-04-02 – 2024-04-03 (×8): 1 mg via INTRAVENOUS
  Filled 2024-04-02 (×8): qty 1

## 2024-04-02 MED ORDER — OXYCODONE HCL 5 MG PO TABS
10.0000 mg | ORAL_TABLET | ORAL | Status: DC | PRN
  Administered 2024-04-02 – 2024-04-03 (×6): 15 mg via ORAL
  Filled 2024-04-02 (×6): qty 3

## 2024-04-02 MED ORDER — METHOCARBAMOL 500 MG PO TABS
1000.0000 mg | ORAL_TABLET | Freq: Three times a day (TID) | ORAL | Status: DC
  Administered 2024-04-02 – 2024-04-03 (×5): 1000 mg via ORAL
  Filled 2024-04-02 (×5): qty 2

## 2024-04-02 MED ORDER — LIDOCAINE 5 % EX PTCH
2.0000 | MEDICATED_PATCH | CUTANEOUS | Status: DC
  Administered 2024-04-02 – 2024-04-03 (×2): 2 via TRANSDERMAL
  Filled 2024-04-02 (×2): qty 2

## 2024-04-02 MED ORDER — POLYETHYLENE GLYCOL 3350 17 G PO PACK
17.0000 g | PACK | Freq: Every day | ORAL | Status: DC
  Administered 2024-04-02: 17 g via ORAL
  Filled 2024-04-02: qty 1
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
## 2024-04-02 NOTE — Progress Notes (Signed)
  Echocardiogram 2D Echocardiogram has been attempted, pt unavailable. Will come back in 30 mins when patient is available  Tinnie FORBES Gosling RDCS 04/02/2024, 1:01 PM

## 2024-04-02 NOTE — Progress Notes (Addendum)
 Per report from previous RN  that labs were not able to be draw from PICC this AM  due to heparin  gtt running as he was told via POLICY. IV team consulted for further clarification. Awaiting on consult.    Per recent consultation with the IV team and pharmacy, it has been confirmed that labs can be drawn from the PICC line if the correct procedure is followed.

## 2024-04-02 NOTE — Progress Notes (Signed)
 VASCULAR LAB    Bilateral lower extremity venous duplex has been performed.  See CV proc for preliminary results.   Keniya Schlotterbeck, RVT 04/02/2024, 4:29 PM
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
## 2024-04-02 NOTE — Plan of Care (Signed)
   Problem: Education: Goal: Knowledge of General Education information will improve Description Including pain rating scale, medication(s)/side effects and non-pharmacologic comfort measures Outcome: Progressing   Problem: Health Behavior/Discharge Planning: Goal: Ability to manage health-related needs will improve Outcome: Progressing
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
## 2024-04-02 NOTE — Progress Notes (Signed)
  Echocardiogram 2D Echocardiogram has been performed.  Tinnie FORBES Gosling RDCS 04/02/2024, 2:52 PM

## 2024-04-02 NOTE — Plan of Care (Signed)
   Problem: Education: Goal: Knowledge of General Education information will improve Description: Including pain rating scale, medication(s)/side effects and non-pharmacologic comfort measures Outcome: Progressing   Problem: Clinical Measurements: Goal: Will remain free from infection Outcome: Progressing Goal: Respiratory complications will improve Outcome: Progressing
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
## 2024-04-03 ENCOUNTER — Inpatient Hospital Stay (HOSPITAL_COMMUNITY): Admit: 2024-04-03 | Discharge: 2024-04-03 | Disposition: A | Attending: Cardiology | Admitting: Cardiology

## 2024-04-03 ENCOUNTER — Other Ambulatory Visit (HOSPITAL_COMMUNITY): Payer: Self-pay

## 2024-04-03 DIAGNOSIS — R55 Syncope and collapse: Secondary | ICD-10-CM

## 2024-04-03 LAB — CBC
HCT: 30.7 % — ABNORMAL LOW (ref 39.0–52.0)
Hemoglobin: 10.4 g/dL — ABNORMAL LOW (ref 13.0–17.0)
MCH: 32.1 pg (ref 26.0–34.0)
MCHC: 33.9 g/dL (ref 30.0–36.0)
MCV: 94.8 fL (ref 80.0–100.0)
Platelets: 427 K/uL — ABNORMAL HIGH (ref 150–400)
RBC: 3.24 MIL/uL — ABNORMAL LOW (ref 4.22–5.81)
RDW: 14.1 % (ref 11.5–15.5)
WBC: 9.6 K/uL (ref 4.0–10.5)
nRBC: 0 % (ref 0.0–0.2)

## 2024-04-03 LAB — HEPARIN LEVEL (UNFRACTIONATED)
Heparin Unfractionated: 0.15 [IU]/mL — ABNORMAL LOW (ref 0.30–0.70)
Heparin Unfractionated: 0.35 [IU]/mL (ref 0.30–0.70)

## 2024-04-03 MED ORDER — SIMETHICONE 80 MG PO CHEW
80.0000 mg | CHEWABLE_TABLET | Freq: Four times a day (QID) | ORAL | Status: DC | PRN
  Administered 2024-04-03: 80 mg via ORAL
  Filled 2024-04-03: qty 1

## 2024-04-03 MED ORDER — APIXABAN 5 MG PO TABS
5.0000 mg | ORAL_TABLET | Freq: Two times a day (BID) | ORAL | 0 refills | Status: AC
  Filled 2024-04-03: qty 60, 30d supply, fill #0

## 2024-04-03 MED ORDER — HEPARIN SOD (PORK) LOCK FLUSH 100 UNIT/ML IV SOLN
250.0000 [IU] | INTRAVENOUS | Status: AC | PRN
  Administered 2024-04-03: 250 [IU]

## 2024-04-03 MED ORDER — OXYCODONE HCL 10 MG PO TABS
10.0000 mg | ORAL_TABLET | Freq: Four times a day (QID) | ORAL | 0 refills | Status: AC | PRN
  Filled 2024-04-03: qty 20, 5d supply, fill #0

## 2024-04-03 MED ORDER — APIXABAN 5 MG PO TABS
5.0000 mg | ORAL_TABLET | Freq: Once | ORAL | Status: AC
  Administered 2024-04-03: 5 mg via ORAL
  Filled 2024-04-03: qty 1
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
## 2024-04-03 NOTE — Progress Notes (Signed)
ZIO AT applied at hospital Dr. Izora Ribas to read.

## 2024-04-03 NOTE — Plan of Care (Signed)
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
## 2024-04-03 NOTE — Progress Notes (Signed)
 Patient discharge home today.  PICC flush and capped by the IV team for home antibiotic infusion.  Tele discontinued.  AVS discussed.  Patient went home by transport provided by friend.
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
## 2024-04-04 ENCOUNTER — Encounter (HOSPITAL_COMMUNITY): Payer: Self-pay | Admitting: Vascular Surgery

## 2024-04-05 ENCOUNTER — Other Ambulatory Visit: Payer: Self-pay

## 2024-04-05 DIAGNOSIS — S2501XD Minor laceration of thoracic aorta, subsequent encounter: Secondary | ICD-10-CM

## 2024-04-18 ENCOUNTER — Ambulatory Visit (HOSPITAL_COMMUNITY)
Admission: RE | Admit: 2024-04-18 | Discharge: 2024-04-18 | Disposition: A | Discharge status: Home / Self Care | Source: Ambulatory Visit | Attending: Vascular Surgery | Admitting: Vascular Surgery

## 2024-04-18 DIAGNOSIS — S2501XD Minor laceration of thoracic aorta, subsequent encounter: Secondary | ICD-10-CM | POA: Insufficient documentation

## 2024-04-18 MED ORDER — IOHEXOL 350 MG/ML SOLN
75.0000 mL | Freq: Once | INTRAVENOUS | Status: AC | PRN
  Administered 2024-04-18: 75 mL via INTRAVENOUS

## 2024-04-21 ENCOUNTER — Encounter: Payer: Self-pay | Admitting: Internal Medicine

## 2024-04-21 ENCOUNTER — Ambulatory Visit: Payer: Self-pay | Admitting: Internal Medicine

## 2024-04-21 ENCOUNTER — Other Ambulatory Visit: Payer: Self-pay

## 2024-04-21 ENCOUNTER — Telehealth: Payer: Self-pay

## 2024-04-21 VITALS — BP 110/73 | HR 82 | Temp 97.9°F | Ht 70.5 in | Wt 168.0 lb

## 2024-04-21 DIAGNOSIS — M272 Inflammatory conditions of jaws: Secondary | ICD-10-CM

## 2024-04-21 DIAGNOSIS — K089 Disorder of teeth and supporting structures, unspecified: Secondary | ICD-10-CM

## 2024-04-21 MED ORDER — AMOXICILLIN-POT CLAVULANATE 875-125 MG PO TABS: 1.0000 | ORAL_TABLET | Freq: Two times a day (BID) | ORAL | 1 refills | Status: AC

## 2024-04-21 NOTE — Patient Instructions (Signed)
 On 04/28/23 finish iv antibiotics, but leave picc in  Start augmentin  twice a day on 04/28/24 See us  again 05/08/24; I wont be avaiable   If there is increased swelling/pain or fever chill please discuss with ENT

## 2024-04-21 NOTE — Telephone Encounter (Addendum)
 UPDATE: Per Dr. Overton, patient to continue Zosyn  through 04/27/24, then stop. He will start PO Augmentin  on 04/28/24 and maintain PICC line until appointment on 05/08/24.  New orders sent to [facility].   Laquasha Groome, BSN, RN

## 2024-04-21 NOTE — Telephone Encounter (Signed)
 error

## 2024-04-21 NOTE — Telephone Encounter (Signed)
 Per Dr. Overton, patient should STOP zosyn , but MAINTAIN PICC line. He would like home health to get one more set of labs next week. Patient will follow up with Dr. Luiz on 1/12 at which time decision regarding PICC line will be made.   Orders sent to Holley Herring, RN with [facility].   Shalisa Mcquade, BSN, RN
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
## 2024-04-24 NOTE — Telephone Encounter (Signed)
 Patient called with questions about having PICC removed on 1/1. Reviewed plan with him that he will take his last dose of Zosyn  on 1/1 and start PO Augmentin  on 1/2, but that PICC will remain in place until he sees us  on 1/12. Reviewed flushing the line with normal saline BID to maintain patency. Patient verbalized understanding and has no further questions.   Renaye Janicki, BSN, RN

## 2024-04-26 LAB — COLOGUARD

## 2024-05-01 DIAGNOSIS — R55 Syncope and collapse: Secondary | ICD-10-CM

## 2024-05-01 NOTE — Addendum Note (Signed)
 Encounter addended by: Malvina Pina A on: 05/01/2024 10:08 AM  Actions taken: Imaging Exam ended
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
## 2024-05-04 ENCOUNTER — Encounter: Payer: Self-pay | Admitting: Cardiology

## 2024-05-04 ENCOUNTER — Ambulatory Visit: Attending: Vascular Surgery | Admitting: Vascular Surgery

## 2024-05-04 ENCOUNTER — Ambulatory Visit (INDEPENDENT_AMBULATORY_CARE_PROVIDER_SITE_OTHER): Admitting: Cardiology

## 2024-05-04 ENCOUNTER — Encounter: Payer: Self-pay | Admitting: Vascular Surgery

## 2024-05-04 VITALS — BP 124/77 | HR 71 | Temp 98.3°F | Resp 20 | Ht 70.5 in | Wt 169.9 lb

## 2024-05-04 VITALS — BP 114/60 | Ht 70.5 in | Wt 171.2 lb

## 2024-05-04 DIAGNOSIS — M546 Pain in thoracic spine: Secondary | ICD-10-CM | POA: Insufficient documentation

## 2024-05-04 DIAGNOSIS — Z86718 Personal history of other venous thrombosis and embolism: Secondary | ICD-10-CM

## 2024-05-04 DIAGNOSIS — M869 Osteomyelitis, unspecified: Secondary | ICD-10-CM | POA: Insufficient documentation

## 2024-05-04 DIAGNOSIS — S2501XD Minor laceration of thoracic aorta, subsequent encounter: Secondary | ICD-10-CM | POA: Insufficient documentation

## 2024-05-04 DIAGNOSIS — R55 Syncope and collapse: Secondary | ICD-10-CM

## 2024-05-04 DIAGNOSIS — Z95828 Presence of other vascular implants and grafts: Secondary | ICD-10-CM | POA: Insufficient documentation

## 2024-05-04 LAB — CBC
Hematocrit: 50.2 % (ref 37.5–51.0)
Hemoglobin: 16.2 g/dL (ref 13.0–17.7)
MCH: 30.9 pg (ref 26.6–33.0)
MCHC: 32.3 g/dL (ref 31.5–35.7)
MCV: 96 fL (ref 79–97)
Platelets: 312 x10E3/uL (ref 150–450)
RBC: 5.25 x10E6/uL (ref 4.14–5.80)
RDW: 13 % (ref 11.6–15.4)
WBC: 9.7 x10E3/uL (ref 3.4–10.8)

## 2024-05-04 MED ORDER — ASPIRIN 81 MG PO TBEC: 81.0000 mg | DELAYED_RELEASE_TABLET | Freq: Every day | ORAL | 2 refills | Status: AC

## 2024-05-04 NOTE — Patient Instructions (Signed)
 Medication Instructions:  Your physician recommends that you continue on your current medications as directed. Please refer to the Current Medication list given to you today.  *If you need a refill on your cardiac medications before your next appointment, please call your pharmacy*  Lab Work: CBC today. If you have labs (blood work) drawn today and your tests are completely normal, you will receive your results only by: MyChart Message (if you have MyChart) OR A paper copy in the mail If you have any lab test that is abnormal or we need to change your treatment, we will call you to review the results.  Testing/Procedures: None   Follow-Up: At [facility], you and your health needs are our priority.  As part of our continuing mission to provide you with exceptional heart care, our providers are all part of one team.  This team includes your primary Cardiologist (physician) and Advanced Practice Providers or APPs (Physician Assistants and Nurse Practitioners) who all work together to provide you with the care you need, when you need it.  Your next appointment:   3 month(s)  Provider:   Rollo Louder, PA-C          We recommend signing up for the patient portal called MyChart.  Sign up information is provided on this After Visit Summary.  MyChart is used to connect with patients for Virtual Visits (Telemedicine).  Patients are able to view lab/test results, encounter notes, upcoming appointments, etc.  Non-urgent messages can be sent to your provider as well.   To learn more about what you can do with MyChart, go to [email or web address].   Other Instructions None

## 2024-05-05 ENCOUNTER — Telehealth: Payer: Self-pay | Admitting: Cardiology

## 2024-05-05 ENCOUNTER — Ambulatory Visit: Payer: Self-pay | Admitting: Cardiology

## 2024-05-05 NOTE — Telephone Encounter (Signed)
 Pt returning nurse call about results and would like a c/b regarding this matter, please advise.

## 2024-05-08 ENCOUNTER — Encounter: Payer: Self-pay | Admitting: Internal Medicine

## 2024-05-08 ENCOUNTER — Other Ambulatory Visit: Payer: Self-pay

## 2024-05-08 ENCOUNTER — Ambulatory Visit: Payer: Self-pay | Admitting: Internal Medicine

## 2024-05-08 VITALS — BP 119/74 | HR 64 | Temp 98.1°F | Ht 70.5 in | Wt 176.0 lb

## 2024-05-08 DIAGNOSIS — M272 Inflammatory conditions of jaws: Secondary | ICD-10-CM | POA: Diagnosis present

## 2024-05-08 NOTE — Progress Notes (Signed)
 Labs drawn via PICC line per Dr. Luiz. Line flushed with 10 mL normal saline and clamped. Patient tolerated procedure well.    PICC Removal    PICC length & location: left cephalic 42 cm Removed per verbal order from: Dr. Luiz  Blood thinners: Eliquis  5mg ; aspirin  81 mg Platelet count: 312 (05/04/24)  Site assessment: Dressing clean and dry. Extremity warm and dry. No drainage or swelling present at insertion site. Mild erythema approximately 1 cm in diameter at insertion site. Patient reports this started when glue was placed over the insertion site during his most recent hospital admission. Advised him to monitor the site and notify provider if redness worsens/spreads, he develops drainage, swelling, fever, or chills.   Pre-removal vital signs:  BP: 114/73 HR: 67 SpO2: 98%  Insertion site positioned below level of heart. No sutures present. Insertion site cleaned with CHG, petroleum dressing applied and catheter removed without difficulty. Tip intact. Pressure held until hemostasis achieved.    Length of catheter removed: 42 cm   Provided patient with after care instructions and precautions print out (via [employer] Clinical Key). Reviewed this information with patient.   Patient verbalized understanding and agreement, all questions answered. Patient tolerated procedure well and remained in clinic under the care of RN 30 minutes post removal.  Post-observation vital signs:  BP: 119/74 HR: 64 SpO2: 100%  Notified Holley Herring, RN with [facility] and [facility] pharmacy team of removal.  Duwaine Lowe, BSN, RN

## 2024-05-08 NOTE — Telephone Encounter (Signed)
 Spoke with patient regarding lab results.  Per Rollo Louder, PA-C Please tell patient that his lab work from yesterday was normal. No evidence of anemia  Pt verbalizes understanding.
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
## 2024-05-09 LAB — CBC WITH DIFFERENTIAL/PLATELET
Absolute Lymphocytes: 2038 {cells}/uL (ref 850–3900)
Absolute Monocytes: 757 {cells}/uL (ref 200–950)
Basophils Absolute: 36 {cells}/uL (ref 0–200)
Basophils Relative: 0.4 %
Eosinophils Absolute: 312 {cells}/uL (ref 15–500)
Eosinophils Relative: 3.5 %
HCT: 43.6 % (ref 39.4–51.1)
Hemoglobin: 14.3 g/dL (ref 13.2–17.1)
MCH: 31.6 pg (ref 27.0–33.0)
MCHC: 32.8 g/dL (ref 31.6–35.4)
MCV: 96.2 fL (ref 81.4–101.7)
MPV: 10.2 fL (ref 7.5–12.5)
Monocytes Relative: 8.5 %
Neutro Abs: 5758 {cells}/uL (ref 1500–7800)
Neutrophils Relative %: 64.7 %
Platelets: 284 Thousand/uL (ref 140–400)
RBC: 4.53 Million/uL (ref 4.20–5.80)
RDW: 12.7 % (ref 11.0–15.0)
Total Lymphocyte: 22.9 %
WBC: 8.9 Thousand/uL (ref 3.8–10.8)

## 2024-05-09 LAB — SEDIMENTATION RATE: Sed Rate: 2 mm/h (ref 0–15)

## 2024-05-09 LAB — C-REACTIVE PROTEIN: CRP: 5.3 mg/L

## 2024-05-10 ENCOUNTER — Encounter: Payer: Self-pay | Admitting: Internal Medicine

## 2024-05-12 LAB — COLOGUARD

## 2024-05-17 ENCOUNTER — Inpatient Hospital Stay
Admission: RE | Admit: 2024-05-17 | Discharge: 2024-05-17 | Disposition: A | Source: Ambulatory Visit | Attending: Internal Medicine | Admitting: Internal Medicine

## 2024-05-17 DIAGNOSIS — M272 Inflammatory conditions of jaws: Secondary | ICD-10-CM

## 2024-05-17 MED ORDER — IOPAMIDOL (ISOVUE-300) INJECTION 61%
80.0000 mL | Freq: Once | INTRAVENOUS | Status: AC | PRN
  Administered 2024-05-17: 80 mL via INTRAVENOUS

## 2024-06-07 ENCOUNTER — Ambulatory Visit: Admitting: Internal Medicine

## 2024-08-02 ENCOUNTER — Ambulatory Visit: Admitting: Cardiology
